# Patient Record
Sex: Female | Born: 1960 | Race: Black or African American | Hispanic: No | Marital: Married | Smoking: Never smoker
Health system: Southern US, Community
[De-identification: ages and names within clinical notes are randomized; demographics above are authoritative.]

## PROBLEM LIST (undated history)

## (undated) DIAGNOSIS — I10 Essential (primary) hypertension: Secondary | ICD-10-CM

## (undated) DIAGNOSIS — R002 Palpitations: Secondary | ICD-10-CM

## (undated) DIAGNOSIS — E669 Obesity, unspecified: Secondary | ICD-10-CM

## (undated) HISTORY — DX: Obesity, unspecified: E66.9

## (undated) HISTORY — DX: Palpitations: R00.2

## (undated) HISTORY — PX: ECTOPIC PREGNANCY SURGERY: SHX613

---

## 1988-02-10 HISTORY — PX: ECTOPIC PREGNANCY SURGERY: SHX613

## 1998-02-09 ENCOUNTER — Emergency Department (HOSPITAL_COMMUNITY): Admission: EM | Admit: 1998-02-09 | Discharge: 1998-02-09 | Payer: Self-pay | Admitting: Emergency Medicine

## 1998-02-09 ENCOUNTER — Encounter: Payer: Self-pay | Admitting: Emergency Medicine

## 1999-03-12 ENCOUNTER — Other Ambulatory Visit: Admission: RE | Admit: 1999-03-12 | Discharge: 1999-03-12 | Payer: Self-pay | Admitting: Obstetrics and Gynecology

## 2000-09-03 ENCOUNTER — Encounter: Payer: Self-pay | Admitting: Obstetrics and Gynecology

## 2000-09-03 ENCOUNTER — Ambulatory Visit (HOSPITAL_COMMUNITY): Admission: RE | Admit: 2000-09-03 | Discharge: 2000-09-03 | Payer: Self-pay | Admitting: Obstetrics and Gynecology

## 2000-09-08 ENCOUNTER — Emergency Department (HOSPITAL_COMMUNITY): Admission: EM | Admit: 2000-09-08 | Discharge: 2000-09-09 | Payer: Self-pay | Admitting: *Deleted

## 2001-09-05 ENCOUNTER — Ambulatory Visit (HOSPITAL_COMMUNITY): Admission: RE | Admit: 2001-09-05 | Discharge: 2001-09-05 | Payer: Self-pay | Admitting: Obstetrics and Gynecology

## 2001-09-05 ENCOUNTER — Encounter: Payer: Self-pay | Admitting: Family Medicine

## 2003-09-20 ENCOUNTER — Emergency Department (HOSPITAL_COMMUNITY): Admission: EM | Admit: 2003-09-20 | Discharge: 2003-09-21 | Payer: Self-pay | Admitting: Emergency Medicine

## 2004-02-14 ENCOUNTER — Emergency Department (HOSPITAL_COMMUNITY): Admission: EM | Admit: 2004-02-14 | Discharge: 2004-02-14 | Payer: Self-pay | Admitting: Emergency Medicine

## 2004-05-14 ENCOUNTER — Emergency Department (HOSPITAL_COMMUNITY): Admission: EM | Admit: 2004-05-14 | Discharge: 2004-05-14 | Payer: Self-pay | Admitting: Emergency Medicine

## 2004-10-11 ENCOUNTER — Emergency Department (HOSPITAL_COMMUNITY): Admission: EM | Admit: 2004-10-11 | Discharge: 2004-10-11 | Payer: Self-pay | Admitting: Emergency Medicine

## 2005-06-24 ENCOUNTER — Emergency Department (HOSPITAL_COMMUNITY): Admission: EM | Admit: 2005-06-24 | Discharge: 2005-06-24 | Payer: Self-pay | Admitting: Emergency Medicine

## 2005-11-15 ENCOUNTER — Emergency Department (HOSPITAL_COMMUNITY): Admission: EM | Admit: 2005-11-15 | Discharge: 2005-11-15 | Payer: Self-pay | Admitting: Emergency Medicine

## 2006-03-18 ENCOUNTER — Emergency Department (HOSPITAL_COMMUNITY): Admission: EM | Admit: 2006-03-18 | Discharge: 2006-03-19 | Payer: Self-pay | Admitting: Emergency Medicine

## 2007-10-21 ENCOUNTER — Emergency Department (HOSPITAL_COMMUNITY): Admission: EM | Admit: 2007-10-21 | Discharge: 2007-10-21 | Payer: Self-pay | Admitting: Family Medicine

## 2008-02-29 ENCOUNTER — Emergency Department (HOSPITAL_COMMUNITY): Admission: EM | Admit: 2008-02-29 | Discharge: 2008-02-29 | Payer: Self-pay | Admitting: Emergency Medicine

## 2009-05-12 ENCOUNTER — Emergency Department (HOSPITAL_COMMUNITY): Admission: EM | Admit: 2009-05-12 | Discharge: 2009-05-12 | Payer: Self-pay | Admitting: Emergency Medicine

## 2010-03-01 ENCOUNTER — Encounter: Payer: Self-pay | Admitting: Obstetrics and Gynecology

## 2010-03-31 ENCOUNTER — Emergency Department (HOSPITAL_COMMUNITY)
Admission: EM | Admit: 2010-03-31 | Discharge: 2010-03-31 | Disposition: A | Discharge status: Home / Self Care | Payer: Self-pay | Attending: Emergency Medicine | Admitting: Emergency Medicine

## 2010-03-31 ENCOUNTER — Emergency Department (HOSPITAL_COMMUNITY): Payer: Self-pay

## 2010-03-31 DIAGNOSIS — K2970 Gastritis, unspecified, without bleeding: Secondary | ICD-10-CM | POA: Insufficient documentation

## 2010-03-31 DIAGNOSIS — R05 Cough: Secondary | ICD-10-CM | POA: Insufficient documentation

## 2010-03-31 DIAGNOSIS — R059 Cough, unspecified: Secondary | ICD-10-CM | POA: Insufficient documentation

## 2010-03-31 DIAGNOSIS — R079 Chest pain, unspecified: Secondary | ICD-10-CM | POA: Insufficient documentation

## 2010-03-31 DIAGNOSIS — B9789 Other viral agents as the cause of diseases classified elsewhere: Secondary | ICD-10-CM | POA: Insufficient documentation

## 2010-03-31 LAB — URINE MICROSCOPIC-ADD ON

## 2010-03-31 LAB — URINALYSIS, ROUTINE W REFLEX MICROSCOPIC
Hgb urine dipstick: NEGATIVE
Nitrite: NEGATIVE
Protein, ur: 30 mg/dL — AB
Specific Gravity, Urine: 1.036 — ABNORMAL HIGH (ref 1.005–1.030)

## 2010-03-31 LAB — COMPREHENSIVE METABOLIC PANEL
AST: 21 U/L (ref 0–37)
Alkaline Phosphatase: 66 U/L (ref 39–117)
BUN: 9 mg/dL (ref 6–23)
CO2: 25 mEq/L (ref 19–32)
GFR calc Af Amer: >60 mL/min (ref >60)
GFR calc non Af Amer: >60 mL/min (ref >60)
Glucose, Bld: 174 mg/dL — ABNORMAL HIGH (ref 70–99)
Potassium: 3.6 mEq/L (ref 3.5–5.1)

## 2010-03-31 LAB — CBC
Hemoglobin: 12.6 g/dL (ref 12.0–15.0)
MCH: 29.3 pg (ref 26.0–34.0)
MCHC: 33.2 g/dL (ref 30.0–36.0)
MCV: 88.4 fL (ref 78.0–100.0)
RBC: 4.3 MIL/uL (ref 3.87–5.11)

## 2010-03-31 LAB — RAPID STREP SCREEN (MED CTR MEBANE ONLY): Streptococcus, Group A Screen (Direct): NEGATIVE

## 2010-03-31 LAB — DIFFERENTIAL
Eosinophils Relative: 1 % (ref 0–5)
Lymphocytes Relative: 18 % (ref 12–46)
Lymphs Abs: 1.4 10*3/uL (ref 0.7–4.0)
Monocytes Absolute: 0.7 10*3/uL (ref 0.1–1.0)
Monocytes Relative: 9 % (ref 3–12)
Neutro Abs: 5.9 10*3/uL (ref 1.7–7.7)

## 2010-04-30 LAB — URINALYSIS, ROUTINE W REFLEX MICROSCOPIC
Glucose, UA: NEGATIVE mg/dL
Hgb urine dipstick: NEGATIVE
Nitrite: NEGATIVE
Protein, ur: NEGATIVE mg/dL
Specific Gravity, Urine: 1.004 — ABNORMAL LOW (ref 1.005–1.030)
Urobilinogen, UA: 0.2 mg/dL (ref 0.0–1.0)
pH: 6 (ref 5.0–8.0)

## 2010-05-13 ENCOUNTER — Emergency Department (HOSPITAL_COMMUNITY)
Admission: EM | Admit: 2010-05-13 | Discharge: 2010-05-13 | Disposition: A | Discharge status: Home / Self Care | Payer: Self-pay | Attending: Emergency Medicine | Admitting: Emergency Medicine

## 2010-05-13 ENCOUNTER — Emergency Department (HOSPITAL_COMMUNITY): Payer: Self-pay

## 2010-05-13 DIAGNOSIS — R5381 Other malaise: Secondary | ICD-10-CM | POA: Insufficient documentation

## 2010-05-13 DIAGNOSIS — R070 Pain in throat: Secondary | ICD-10-CM | POA: Insufficient documentation

## 2010-05-13 DIAGNOSIS — R0982 Postnasal drip: Secondary | ICD-10-CM | POA: Insufficient documentation

## 2010-05-13 DIAGNOSIS — J3489 Other specified disorders of nose and nasal sinuses: Secondary | ICD-10-CM | POA: Insufficient documentation

## 2010-05-13 DIAGNOSIS — IMO0001 Reserved for inherently not codable concepts without codable children: Secondary | ICD-10-CM | POA: Insufficient documentation

## 2010-05-13 DIAGNOSIS — R059 Cough, unspecified: Secondary | ICD-10-CM | POA: Insufficient documentation

## 2010-05-13 DIAGNOSIS — R05 Cough: Secondary | ICD-10-CM | POA: Insufficient documentation

## 2010-05-13 DIAGNOSIS — R5383 Other fatigue: Secondary | ICD-10-CM | POA: Insufficient documentation

## 2010-05-13 DIAGNOSIS — R6883 Chills (without fever): Secondary | ICD-10-CM | POA: Insufficient documentation

## 2010-05-13 DIAGNOSIS — J329 Chronic sinusitis, unspecified: Secondary | ICD-10-CM | POA: Insufficient documentation

## 2010-05-13 DIAGNOSIS — R630 Anorexia: Secondary | ICD-10-CM | POA: Insufficient documentation

## 2010-05-13 LAB — BASIC METABOLIC PANEL
CO2: 27 mEq/L (ref 19–32)
Calcium: 9.6 mg/dL (ref 8.4–10.5)
Chloride: 101 mEq/L (ref 96–112)
Creatinine, Ser: 0.82 mg/dL (ref 0.4–1.2)
GFR calc non Af Amer: >60 mL/min (ref >60)
Glucose, Bld: 192 mg/dL — ABNORMAL HIGH (ref 70–99)
Potassium: 4.1 mEq/L (ref 3.5–5.1)

## 2010-05-13 LAB — CBC
HCT: 35.6 % — ABNORMAL LOW (ref 36.0–46.0)
Hemoglobin: 11.6 g/dL — ABNORMAL LOW (ref 12.0–15.0)
MCHC: 32.6 g/dL (ref 30.0–36.0)

## 2010-05-13 LAB — URINALYSIS, ROUTINE W REFLEX MICROSCOPIC
Hgb urine dipstick: NEGATIVE
Ketones, ur: NEGATIVE mg/dL
Protein, ur: NEGATIVE mg/dL
Specific Gravity, Urine: 1.02 (ref 1.005–1.030)
pH: 8 (ref 5.0–8.0)

## 2010-05-13 LAB — DIFFERENTIAL
Basophils Absolute: 0 10*3/uL (ref 0.0–0.1)
Eosinophils Relative: 2 % (ref 0–5)
Monocytes Relative: 7 % (ref 3–12)
Neutro Abs: 5.7 10*3/uL (ref 1.7–7.7)

## 2010-05-13 LAB — POCT CARDIAC MARKERS: Myoglobin, poc: 74.8 ng/mL (ref 12–200)

## 2010-05-26 LAB — GLUCOSE, CAPILLARY: Glucose-Capillary: 112 mg/dL — ABNORMAL HIGH (ref 70–99)

## 2010-11-12 LAB — POCT URINALYSIS DIP (DEVICE)
Glucose, UA: NEGATIVE
Ketones, ur: NEGATIVE
Operator id: 247071
Specific Gravity, Urine: 1.025
Urobilinogen, UA: 0.2
pH: 5.5

## 2010-12-17 ENCOUNTER — Emergency Department (HOSPITAL_COMMUNITY)
Admission: EM | Admit: 2010-12-17 | Discharge: 2010-12-17 | Disposition: A | Discharge status: Home / Self Care | Payer: Self-pay | Attending: Emergency Medicine | Admitting: Emergency Medicine

## 2010-12-17 ENCOUNTER — Encounter: Payer: Self-pay | Admitting: Emergency Medicine

## 2010-12-17 ENCOUNTER — Other Ambulatory Visit: Payer: Self-pay

## 2010-12-17 ENCOUNTER — Emergency Department (HOSPITAL_COMMUNITY): Payer: Self-pay

## 2010-12-17 DIAGNOSIS — R739 Hyperglycemia, unspecified: Secondary | ICD-10-CM

## 2010-12-17 DIAGNOSIS — M25519 Pain in unspecified shoulder: Secondary | ICD-10-CM | POA: Insufficient documentation

## 2010-12-17 DIAGNOSIS — R7309 Other abnormal glucose: Secondary | ICD-10-CM | POA: Insufficient documentation

## 2010-12-17 DIAGNOSIS — R079 Chest pain, unspecified: Secondary | ICD-10-CM | POA: Insufficient documentation

## 2010-12-17 DIAGNOSIS — Z79899 Other long term (current) drug therapy: Secondary | ICD-10-CM | POA: Insufficient documentation

## 2010-12-17 DIAGNOSIS — N926 Irregular menstruation, unspecified: Secondary | ICD-10-CM | POA: Insufficient documentation

## 2010-12-17 DIAGNOSIS — E669 Obesity, unspecified: Secondary | ICD-10-CM | POA: Insufficient documentation

## 2010-12-17 DIAGNOSIS — J45909 Unspecified asthma, uncomplicated: Secondary | ICD-10-CM | POA: Insufficient documentation

## 2010-12-17 LAB — CBC
HCT: 36.8 % (ref 36.0–46.0)
Hemoglobin: 12.6 g/dL (ref 12.0–15.0)
MCH: 29.7 pg (ref 26.0–34.0)
MCHC: 34.2 g/dL (ref 30.0–36.0)
MCV: 86.8 fL (ref 78.0–100.0)

## 2010-12-17 LAB — COMPREHENSIVE METABOLIC PANEL
Albumin: 3.7 g/dL (ref 3.5–5.2)
BUN: 11 mg/dL (ref 6–23)
Creatinine, Ser: 0.48 mg/dL — ABNORMAL LOW (ref 0.50–1.10)
GFR calc Af Amer: >90 mL/min (ref >90)
Glucose, Bld: 248 mg/dL — ABNORMAL HIGH (ref 70–99)
Total Bilirubin: 0.5 mg/dL (ref 0.3–1.2)
Total Protein: 7.8 g/dL (ref 6.0–8.3)

## 2010-12-17 LAB — POCT I-STAT TROPONIN I: Troponin i, poc: 0 ng/mL (ref 0.00–0.08)

## 2010-12-17 LAB — URINALYSIS, ROUTINE W REFLEX MICROSCOPIC
Glucose, UA: 500 mg/dL — AB
Leukocytes, UA: NEGATIVE
Nitrite: NEGATIVE
Protein, ur: NEGATIVE mg/dL
pH: 6 (ref 5.0–8.0)

## 2010-12-17 LAB — URINE MICROSCOPIC-ADD ON

## 2010-12-17 LAB — D-DIMER, QUANTITATIVE: D-Dimer, Quant: <0.22 ug/mL-FEU (ref 0.00–0.48)

## 2010-12-17 LAB — DIFFERENTIAL
Basophils Relative: 1 % (ref 0–1)
Eosinophils Absolute: 0.1 10*3/uL (ref 0.0–0.7)
Monocytes Absolute: 0.5 10*3/uL (ref 0.1–1.0)
Monocytes Relative: 8 % (ref 3–12)

## 2010-12-17 MED ORDER — METFORMIN HCL 500 MG PO TABS: 500.0000 mg | ORAL_TABLET | Freq: Two times a day (BID) | ORAL | Status: DC

## 2010-12-17 MED ORDER — NITROGLYCERIN 0.4 MG SL SUBL
0.4000 mg | SUBLINGUAL_TABLET | SUBLINGUAL | Status: DC | PRN
  Administered 2010-12-17: 0.4 mg via SUBLINGUAL
  Filled 2010-12-17: qty 25

## 2010-12-17 NOTE — ED Notes (Signed)
Old and new ekg given to Dr. Earlene Plater

## 2010-12-17 NOTE — ED Notes (Signed)
Pt is hooked to monitor, pulse oximetry, and BP cuff.

## 2010-12-17 NOTE — ED Provider Notes (Signed)
History     CSN: [id number] Arrival date & time: 12/17/2010  7:38 AM   First MD Initiated Contact with Patient 12/17/10 [phone]      Chief Complaint  Patient presents with  . Chest Pain    (Consider location/radiation/quality/duration/timing/severity/associated sxs/prior treatment) HPI Comments: Patient is a 50 year old woman who says that last night she developed pain in her left shoulder and left chest. There is a slight pain in the right side of her chest also. She rated the pain is at 10 at that time. It lasted 10-15 minutes and went away. She also felt hot and cold at that time. She took no medication. She went to work this morning asymptomatic, will probably work developed the pain again. 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 She then was brought to the [facility] for evaluation. She has no history of heart disease in the past. She currently rates her pain at a 9.  Patient is a 50 y.o. female presenting with chest pain. The history is provided by the patient. No language interpreter was used.  Chest Pain The chest pain began 6 - 12 hours ago. Duration of episode(s) is 15 minutes. Chest pain occurs intermittently. The chest pain is unchanged. At its most intense, the pain is at 10/10. The pain is currently at 9/10. The severity of the pain is severe. The quality of the pain is described as dull. The pain does not radiate. She tried aspirin for the symptoms. Risk factors include obesity.     Past Medical History  Diagnosis Date  . Asthma     Past Surgical History  Procedure Date  . Ectopic pregnancy surgery 1990    History reviewed. No pertinent family history.  History  Substance Use Topics  . Smoking status: Never Smoker   . Smokeless tobacco: Never Used  . Alcohol Use: Yes     social drinker    OB History    Grav Para Term Preterm Abortions TAB SAB Ect Mult Living                  Review of Systems    Constitutional: Chills: she felt hot and cold.  HENT: Negative.   Eyes: Negative.   Respiratory: Negative.   Cardiovascular: Positive for chest pain.  Gastrointestinal: Negative.   Genitourinary: Negative.  Vaginal bleeding: menses are irregular.  Musculoskeletal: Negative.        There is no history of long car or airplane trips. She has no history of malignancy.  Neurological: Negative.   Psychiatric/Behavioral: Negative.     Allergies  Review of patient's allergies indicates no known allergies.  Home Medications   Current Outpatient Rx  Name Route Sig Dispense Refill  . METFORMIN HCL 500 MG PO TABS Oral Take 1 tablet (500 mg total) by mouth 2 (two) times daily with a meal. 60 tablet 0    BP 123/84  Pulse 81  Temp(Src) 98.3 F (36.8 C) (Oral)  Resp 10  Ht 5\' 6"  (1.676 m)  Wt 198 lb (89.812 kg)  BMI 31.96 kg/m2  SpO2 98%  Physical Exam  Constitutional: She is oriented to person, place, and time. She appears well-developed and well-nourished. Distressed:  Patient is in mild to moderate distress complaining of chest pain.  HENT:  Head: Normocephalic and atraumatic.  Right Ear: External ear normal.  Left Ear: External ear normal.  Mouth/Throat: Oropharynx is clear and moist.  Eyes:  Conjunctivae and EOM are normal. Pupils are equal, round, and reactive to light.  Neck: Normal range of motion. Neck supple.  Cardiovascular: Normal rate, regular rhythm and normal heart sounds.   Pulmonary/Chest: Effort normal and breath sounds normal. She exhibits no tenderness.  Abdominal: Soft. Bowel sounds are normal. There is no tenderness.  Musculoskeletal: Normal range of motion. She exhibits no edema and no tenderness.       No calf tenderness, and no Homans sign.  Neurological: She is alert and oriented to person, place, and time.       No sensory or motor deficit.  Skin: Skin is warm and dry.  Psychiatric: She has a normal mood and affect. Her behavior is normal.    ED Course   Procedures (including critical care time)  1:05 PM  Date: 12/17/2010  Rate: 96  Rhythm: normal sinus rhythm  QRS Axis: normal  Intervals: normal  ST/T Wave abnormalities: nonspecific T wave changes  Conduction Disutrbances:none  Narrative Interpretation: Abnormal EKG.   Old EKG Reviewed: unchanged  1:05 PM Pt seen --> physical exam performed.  Lab workup ordered.  EKG benign.  SL NTG ordered.  1:05 PM Cardiac workup was negative, but she had an elevated glucose of 248.  Will Rx with metformin 500 mg bid, refer to Dr. Della Goo for a PCP.   1. Chest pain, unspecified   2. Hyperglycemia              Carleene Cooper III, MD 12/17/10 [phone]

## 2010-12-17 NOTE — ED Notes (Signed)
EKG performed by Benedict Needy in triage.

## 2011-02-05 ENCOUNTER — Encounter (HOSPITAL_COMMUNITY): Payer: Self-pay | Admitting: Emergency Medicine

## 2011-02-05 ENCOUNTER — Emergency Department (HOSPITAL_COMMUNITY): Payer: Self-pay

## 2011-02-05 ENCOUNTER — Emergency Department (HOSPITAL_COMMUNITY)
Admission: EM | Admit: 2011-02-05 | Discharge: 2011-02-05 | Disposition: A | Discharge status: Home / Self Care | Payer: Self-pay | Attending: Emergency Medicine | Admitting: Emergency Medicine

## 2011-02-05 DIAGNOSIS — E119 Type 2 diabetes mellitus without complications: Secondary | ICD-10-CM | POA: Insufficient documentation

## 2011-02-05 DIAGNOSIS — J3489 Other specified disorders of nose and nasal sinuses: Secondary | ICD-10-CM | POA: Insufficient documentation

## 2011-02-05 DIAGNOSIS — I10 Essential (primary) hypertension: Secondary | ICD-10-CM | POA: Insufficient documentation

## 2011-02-05 DIAGNOSIS — B349 Viral infection, unspecified: Secondary | ICD-10-CM

## 2011-02-05 DIAGNOSIS — R6883 Chills (without fever): Secondary | ICD-10-CM | POA: Insufficient documentation

## 2011-02-05 DIAGNOSIS — J029 Acute pharyngitis, unspecified: Secondary | ICD-10-CM | POA: Insufficient documentation

## 2011-02-05 DIAGNOSIS — R059 Cough, unspecified: Secondary | ICD-10-CM | POA: Insufficient documentation

## 2011-02-05 DIAGNOSIS — R51 Headache: Secondary | ICD-10-CM | POA: Insufficient documentation

## 2011-02-05 DIAGNOSIS — IMO0001 Reserved for inherently not codable concepts without codable children: Secondary | ICD-10-CM | POA: Insufficient documentation

## 2011-02-05 DIAGNOSIS — R5381 Other malaise: Secondary | ICD-10-CM | POA: Insufficient documentation

## 2011-02-05 DIAGNOSIS — R05 Cough: Secondary | ICD-10-CM | POA: Insufficient documentation

## 2011-02-05 DIAGNOSIS — J45909 Unspecified asthma, uncomplicated: Secondary | ICD-10-CM | POA: Insufficient documentation

## 2011-02-05 DIAGNOSIS — B9789 Other viral agents as the cause of diseases classified elsewhere: Secondary | ICD-10-CM | POA: Insufficient documentation

## 2011-02-05 HISTORY — DX: Essential (primary) hypertension: I10

## 2011-02-05 LAB — GLUCOSE, CAPILLARY

## 2011-02-05 MED ORDER — METFORMIN HCL 500 MG PO TABS: 500.0000 mg | ORAL_TABLET | Freq: Two times a day (BID) | ORAL | Status: DC

## 2011-02-05 NOTE — ED Notes (Signed)
PT. REPORTS PRODUCTIVE COUGH WITH HEADACHE AND BODY ACHES FOR 4 DAYS , RUNNY NOSE , CHILLS WITH FEVER.

## 2011-02-05 NOTE — ED Provider Notes (Signed)
Medical screening examination/treatment/procedure(s) were performed by non-physician practitioner and as supervising physician I was immediately available for consultation/collaboration.   Forbes Cellar, MD 02/05/11 [phone]

## 2011-02-05 NOTE — ED Provider Notes (Signed)
History     CSN: [id number]  Arrival date & time 02/05/11  4098   First MD Initiated Contact with Patient 02/05/11 [phone]      Chief Complaint  Patient presents with  . Cough    (Consider location/radiation/quality/duration/timing/severity/associated sxs/prior treatment) Patient is a 50 y.o. female presenting with cough. The history is provided by the patient.  Cough This is a new problem. The current episode started more than 2 days ago. The problem has not changed since onset.The cough is productive of sputum. There has been no fever. Associated symptoms include chills, headaches, rhinorrhea, sore throat and myalgias. Pertinent negatives include no chest pain, no ear pain, no shortness of breath and no wheezing. She has tried decongestants for the symptoms. The treatment provided mild relief. She is not a smoker. Her past medical history is significant for asthma.   the patient works in a nursing home. She has been feeling ill for 4 days but denies any fever. Did not receive a flu vaccine this year.  Past Medical History  Diagnosis Date  . Asthma   . Hypertension   . Diabetes mellitus     Past Surgical History  Procedure Date  . Ectopic pregnancy surgery 1990  . Ectopic pregnancy surgery     No family history on file.  History  Substance Use Topics  . Smoking status: Never Smoker   . Smokeless tobacco: Never Used  . Alcohol Use: Yes     social drinker     Review of Systems  Constitutional: Positive for chills and fatigue. Negative for fever.  HENT: Positive for congestion, sore throat and rhinorrhea. Negative for ear pain, trouble swallowing, neck pain, neck stiffness and tinnitus.   Eyes: Negative for pain and visual disturbance.  Respiratory: Positive for cough. Negative for chest tightness, shortness of breath and wheezing.   Cardiovascular: Negative for chest pain and leg swelling.  Gastrointestinal: Positive for nausea. Negative for vomiting, abdominal pain and  diarrhea.  Genitourinary: Negative for dysuria and hematuria.  Musculoskeletal: Positive for myalgias. Negative for back pain and joint swelling.  Skin: Negative for rash and wound.  Neurological: Positive for headaches. Negative for syncope, weakness and light-headedness.  Psychiatric/Behavioral: Negative for confusion.    Allergies  Review of patient's allergies indicates no known allergies.  Home Medications   Current Outpatient Rx  Name Route Sig Dispense Refill  . METFORMIN HCL 500 MG PO TABS Oral Take 500 mg by mouth once. Not on this regularly.       BP 141/80  Pulse 95  Temp(Src) 99.2 F (37.3 C) (Oral)  Resp 14  SpO2 96%  Physical Exam  Nursing note and vitals reviewed. Constitutional: She is oriented to person, place, and time. She appears well-developed and well-nourished. No distress.  HENT:  Head: Normocephalic and atraumatic.  Right Ear: Tympanic membrane, external ear and ear canal normal.  Left Ear: Tympanic membrane, external ear and ear canal normal.  Nose: Rhinorrhea present.  Mouth/Throat: Uvula is midline and mucous membranes are normal. Posterior oropharyngeal erythema present. No oropharyngeal exudate or posterior oropharyngeal edema.  Eyes: Conjunctivae are normal. Pupils are equal, round, and reactive to light.  Neck: Normal range of motion. Neck supple.  Cardiovascular: Normal rate, regular rhythm, normal heart sounds and intact distal pulses.   Pulmonary/Chest: Effort normal and breath sounds normal. No respiratory distress. She has no wheezes. She exhibits no tenderness.  Abdominal: Soft. Bowel sounds are normal. She exhibits no distension. There is no tenderness.  Musculoskeletal:  Normal range of motion. She exhibits no edema and no tenderness.  Lymphadenopathy:    She has no cervical adenopathy.  Neurological: She is alert and oriented to person, place, and time. No cranial nerve deficit. Coordination normal.  Skin: Skin is warm and dry. No  rash noted. No erythema.  Psychiatric: She has a normal mood and affect. Her behavior is normal.    ED Course  Procedures (including critical care time)  Labs Reviewed  GLUCOSE, CAPILLARY - Abnormal; Notable for the following:    Glucose-Capillary 236 (*)    All other components within normal limits   Dg Chest 2 View  02/05/2011  *RADIOLOGY REPORT*  Clinical Data: Productive cough, headache and body aches.  Chills and fever.  CHEST - 2 VIEW  Comparison: Chest radiograph performed 12/17/2010  Findings: The lungs are well-aerated and clear.  There is no evidence of focal opacification, pleural effusion or pneumothorax. An apparent asymmetric left-sided nipple shadow is noted.  The heart is normal in size; the mediastinal contour is within normal limits.  No acute osseous abnormalities are seen.  IMPRESSION: No acute cardiopulmonary process seen.  Original Report Authenticated By: Tonia Ghent, M.D.     Dx 1: Viral illness Dx 2: Diabetes Mellitus   MDM  The patient's symptoms are most likely secondary to a viral illness. As she has had no fever, influenza is less likely. Her chest x-ray has been reviewed and is negative for any pneumonia. I do not hear any wheezing on auscultation of her lungs to suggest an asthma exacerbation. She is not in any distress and speaks in complete sentences. I have discussed with her the use of over-the-counter medications for symptomatic treatment as well as oral fluid hydration.  The patient reports to me that she is not taking her metformin as was directed by the emergency physician upon her last visit. She denies blurred vision, polyuria, and polydipsia. I counseled her at length regarding the dangers of uncontrolled diabetes and have checked her blood sugar. It is not high enough to warrant insulin therapy, though I have encouraged good oral fluid hydration. She will start taking metformin as per hour discussion and I will put the resource guide on her  discharge paperwork so that she may obtain a primary care physician for continued management.        [address] [place], [place] 02/05/11 0656  Shaaron Adler, PA 02/05/11 [phone]

## 2011-02-05 NOTE — ED Notes (Signed)
Pt c/o cough, congestion and runny nose for a few days. No resp distress noted.

## 2011-04-30 ENCOUNTER — Other Ambulatory Visit: Payer: Self-pay

## 2011-04-30 ENCOUNTER — Emergency Department (INDEPENDENT_AMBULATORY_CARE_PROVIDER_SITE_OTHER)
Admission: EM | Admit: 2011-04-30 | Discharge: 2011-04-30 | Disposition: A | Discharge status: Home / Self Care | Payer: Self-pay | Source: Home / Self Care | Attending: Emergency Medicine | Admitting: Emergency Medicine

## 2011-04-30 ENCOUNTER — Encounter (HOSPITAL_COMMUNITY): Payer: Self-pay | Admitting: Emergency Medicine

## 2011-04-30 DIAGNOSIS — Z760 Encounter for issue of repeat prescription: Secondary | ICD-10-CM

## 2011-04-30 DIAGNOSIS — R079 Chest pain, unspecified: Secondary | ICD-10-CM

## 2011-04-30 LAB — POCT I-STAT, CHEM 8
BUN: 10 mg/dL (ref 6–23)
Calcium, Ion: 1.23 mmol/L (ref 1.12–1.32)
HCT: 45 % (ref 36.0–46.0)
Hemoglobin: 15.3 g/dL — ABNORMAL HIGH (ref 12.0–15.0)
Sodium: 139 mEq/L (ref 135–145)
TCO2: 25 mmol/L (ref 0–100)

## 2011-04-30 MED ORDER — GLUCOSE BLOOD VI STRP: ORAL_STRIP | Status: DC

## 2011-04-30 MED ORDER — METFORMIN HCL 500 MG PO TABS: 500.0000 mg | ORAL_TABLET | Freq: Two times a day (BID) | ORAL | Status: DC

## 2011-04-30 NOTE — Discharge Instructions (Signed)
You must take your metformin on a regular basis. I and prescribing a machine for you to monitor your glucose was. He can also help control your diabetes through diet and exercise and weight loss. Give [place] a working phone number, so that [facility] cardiology can contact you. They want to see you within the next few days for further evaluation, and/or Holter monitoring and a stress test. Return immediately to the ER if you have symptoms that get worse, do not go away, if the pain goes up her neck or down her left arm, nausea, if you feel sweaty, if you feel like you're about to pass out, if you passout, or any other concerns.

## 2011-04-30 NOTE — ED Provider Notes (Signed)
History     CSN: [id number]  Arrival date & time 04/30/11  0845   First MD Initiated Contact with Patient 04/30/11 1007      Chief Complaint  Patient presents with  . Chest Pain    (Consider location/radiation/quality/duration/timing/severity/associated sxs/prior treatment) HPI Comments: Patient presents with intermittent episodes of palpitations, and "achy", chest pain lasting minutes and then resolving. States that it occasionally radiates to her left shoulder. No radiation to neck or down her arm. no exertional or positional component to her pain. No abdominal pain, tearing sensation through to her back. 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 Patient states she felt dizzy, but no presyncope or syncope. Patient was told to try Valsalva maneuver, which stopped her symptoms. Patient denies excess caffeine intake, steroid use. Patient has been having these symptoms since November 2012. She was seen  in [place] for this. Cardiac workup, including troponin x 2, were negative. Was diagnosed with unspecified chest pain. Patient did not have a stress test done.  Patient was also hyperglycemic at 248. She was started on metformin 500 mg twice a day and was referred to a primary care physician. Patient states she takes her metformin on an irregular basis, but has been taking it somewhat consistently recently. Does not have a glucometer at home. No polyuria, polydipsia, abdominal pain, unintentional weight loss.  ROS as noted in HPI. All other ROS negative.   Patient is a 51 y.o. female presenting with chest pain. The history is provided by the patient. No language interpreter was used.  Chest Pain The chest pain began more  than 1 month ago. Chest pain occurs intermittently. The chest pain is unchanged. The pain is associated with stress. The quality of the pain is described as aching. The pain does  not radiate. She tried nothing for the symptoms. Risk factors include obesity and stress.  Her past medical history is significant for diabetes and hypertension.  Pertinent negatives for past medical history include no arrhythmia, no CAD, no CHF, no DVT and no MI.  Pertinent negatives for family medical history include: no CAD in family and no early MI in family.     Past Medical History  Diagnosis Date  . Asthma   . Hypertension   . Diabetes mellitus   . Chest pain     Past Surgical History  Procedure Date  . Ectopic pregnancy surgery 1990  . Ectopic pregnancy surgery     History reviewed. No pertinent family history.  History  Substance Use Topics  . Smoking status: Never Smoker   . Smokeless tobacco: Never Used  . Alcohol Use: Yes     social drinker    OB History    Grav Para Term Preterm Abortions TAB SAB Ect Mult Living                  Review of Systems  Cardiovascular: Positive for chest pain.    Allergies  Review of patient's allergies indicates no known allergies.  Home Medications   Current Outpatient Rx  Name Route Sig Dispense Refill  . GLUCOSE BLOOD VI STRP  Check your sugar in the morning before you eat breakfast, and one hour after a meal. 100 each 12  . METFORMIN HCL 500 MG PO TABS Oral Take 1 tablet (500 mg total) by mouth 2 (two) times daily with a meal. 60 tablet 0  BP 126/81  Pulse 74  Temp(Src) 97.7 F (36.5 C) (Oral)  Resp 16  SpO2 100%  LMP 04/14/2011  Physical Exam  Nursing note and vitals reviewed. Constitutional: She is oriented to person, place, and time. She appears well-developed and well-nourished.  HENT:  Head: Normocephalic and atraumatic.  Eyes: Conjunctivae and EOM are normal.  Neck: Normal range of motion. No thyromegaly present.  Cardiovascular: Normal rate, regular rhythm, normal heart sounds and intact distal pulses.   No murmur heard. Pulmonary/Chest: Effort normal and breath sounds normal.  Abdominal:  Soft. Bowel sounds are normal. She exhibits no distension. There is no tenderness. There is no rebound and no guarding.  Musculoskeletal: Normal range of motion. She exhibits no edema.  Neurological: She is alert and oriented to person, place, and time.  Skin: Skin is warm and dry.  Psychiatric: She has a normal mood and affect. Her behavior is normal. Judgment and thought content normal.    ED Course  Procedures (including critical care time)  Labs Reviewed  POCT I-STAT, CHEM 8 - Abnormal; Notable for the following:    Glucose, Bld 177 (*)    Hemoglobin 15.3 (*)    All other components within normal limits   No results found. Results for orders placed during the hospital encounter of 04/30/11  POCT I-STAT, CHEM 8      Component Value Range   Sodium 139  135 - 145 (mEq/L)   Potassium 3.9  3.5 - 5.1 (mEq/L)   Chloride 103  96 - 112 (mEq/L)   BUN 10  6 - 23 (mg/dL)   Creatinine, Ser 1.61  0.50 - 1.10 (mg/dL)   Glucose, Bld 096 (*) 70 - 99 (mg/dL)   Calcium, Ion 0.45  4.09 - 1.32 (mmol/L)   TCO2 25  0 - 100 (mmol/L)   Hemoglobin 15.3 (*) 12.0 - 15.0 (g/dL)   HCT 81.1  91.4 - 78.2 (%)     1. Chest pain   2. Diabetes mellitus   3. Medication refill     EKG: Normal sinus rhythm, rate 63. Normal axis, normal intervals. No hypertrophy. Nonspecific T-wave changes, present in previous EKG from November 2012.  MDM  Previous labs, records reviewed. As noted in history of present illness.   1119- discussed case with Dr. Tenny Craw, cardiologist on-call. They will contact the patient, and arrange a followup within the next several days for Holter monitor and/or stress test.  EKG unremarkable. Patient's reporting symptoms that are consistent with intermittent SVT. Patient currently asymptomatic. Vital signs within normal limits. She does however, have several risk factors for atypical chest pain/angina. Given patient's recent negative cardiac workup, currently feel that patient is stable  enough for outpatient stress test and/or Holter monitoring.. instructing patient to give [place] a working phone number, so that [facility] cardiology can contact her for a close followup appointment. Repeatedly Emphasized importance of following up with cardiology. Also checking i-STAT, and renewing patient's prescription for metformin. Sending her home with a glucometer so that she can monitor her sugars at home. Will give patient strict instructions on when to return to the ER.    Luiz Blare, MD 04/30/11 1431

## 2011-04-30 NOTE — ED Notes (Signed)
PT HERE WITH MIDSTERNUM CP THAT RADIATES TO LEFT SHOULDER INTERMITT SHARP PAINS THAT STARTED YESTERDAY.EPISODE LASTED X30MIN RELIEVED BY BEARING DOWN,"JUMPING JACKS.PT STATES SHE HAD SAME SX X 1 MNTH AGO AND WAS SEEN AT [place] BUT DOESN'T REMEMBER RESULTS.HX DIABETES 2.

## 2011-05-05 ENCOUNTER — Ambulatory Visit (INDEPENDENT_AMBULATORY_CARE_PROVIDER_SITE_OTHER): Payer: Self-pay | Admitting: Cardiology

## 2011-05-05 ENCOUNTER — Encounter: Payer: Self-pay | Admitting: Cardiology

## 2011-05-05 VITALS — BP 140/82 | HR 71 | Ht 64.0 in | Wt 215.8 lb

## 2011-05-05 DIAGNOSIS — R079 Chest pain, unspecified: Secondary | ICD-10-CM

## 2011-05-05 DIAGNOSIS — E119 Type 2 diabetes mellitus without complications: Secondary | ICD-10-CM

## 2011-05-05 DIAGNOSIS — R002 Palpitations: Secondary | ICD-10-CM

## 2011-05-05 LAB — HEMOGLOBIN A1C: Hgb A1c MFr Bld: 9.5 % — ABNORMAL HIGH (ref 4.6–6.5)

## 2011-05-05 LAB — TSH: TSH: 1.94 u[IU]/mL (ref 0.35–5.50)

## 2011-05-05 NOTE — Patient Instructions (Signed)
Your physician recommends that you have lab work today: TSH and HgbA1c  Your physician has recommended that you wear an event monitor. Event monitors are medical devices that record the heart's electrical activity. Doctors most often [place] these monitors to diagnose arrhythmias. Arrhythmias are problems with the speed or rhythm of the heartbeat. The monitor is a small, portable device. You can wear one while you do your normal daily activities. This is usually used to diagnose what is causing palpitations/syncope (passing out).  Your physician has requested that you have an exercise tolerance test. For further information please visit [email or web address] Please also follow instruction sheet, as given.  Please establish with Primary Care ([facility] [phone])

## 2011-05-05 NOTE — Progress Notes (Signed)
HPI:  Sabrina Brooks is seen as an add on new visit today in referral from urgent care.  She is originally from [place], [place].  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   We had a long discussion today also about the absolute need to establish with primary care medicine for management of her diabetes.  Her metformin has been prescribed through the visits to ER and urgent care over a period of time, and she has not had regular care.  She was seen in the ER back in the fall by Dr. Ignacia Palma with 10/10 chest pain, and it was intermittent.  Her enzymes were negative, and I have reviewed her ECG which demonstrated only non specific T flattening.  Enzymes were negative and she was discharged.  Most recently, she was seen at urgent care and set up for a visit at [facility].  Her main complaint is that of intermittent palpitations.  She has had three episodes, twice at work and once at home.  It feels like her heart is just flying.  She got dizzy with one.  She checked her pulse once and it was only 97.  It lasted less than thirty minutes.  It resolved.  She got a chest ache and some arm pain, and it resolved.    It is not exertional and there is no diaphoresis or nausea.  It will go to the left shoulder, but nowhere else.  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  She uses a Valsalva maneuver, and that resolved the symptoms at the time.  When she walks around work, she does not get tightness in her chest.  She does not have a glucometer at home, and unfortunately has been intermittent in the use of metformin.  I went through with her today many aspects of the impact of uncontrolled diabetes on her care, and how this is more likely to get her into trouble than anything else she does.  She is not a smoker.  Of note, she was referred to Dr. Lovell Sheehan from the ER, but did not go.  There is no family history of premature CAD.  Her father died  in his 54s, and mother has dementia.    Current Outpatient Prescriptions  Medication Sig Dispense Refill  . aspirin 325 MG tablet Take 325 mg by mouth daily.      . metFORMIN (GLUCOPHAGE) 500 MG tablet Take 1 tablet (500 mg total) by mouth 2 (two) times daily with a meal.  60 tablet  0    No Known Allergies  Past Medical History  Diagnosis Date  . Asthma   . Hypertension   . Diabetes mellitus   . Chest pain     Past Surgical History  Procedure Date  . Ectopic pregnancy surgery 1990  . Ectopic pregnancy surgery     No family history on file.  History   Social History  . Marital Status: Married    Spouse Name: N/A    Number of Children: N/A  . Years of Education: N/A   Occupational History  . Not on file.   Social History Main Topics  . Smoking status: Never Smoker   . Smokeless tobacco: Never Used  . Alcohol Use: Yes     social drinker  . Drug Use: No     occasional  . Sexually Active: Not on file   Other  Topics Concern  . Not on file   Social History Narrative  . No narrative on file    ROS: Please see the HPI.  All other systems reviewed and negative.  PHYSICAL EXAM:  BP 140/82  Pulse 71  Ht 5\' 4"  (1.626 m)  Wt 215 lb 12.8 oz (97.886 kg)  BMI 37.04 kg/m2  LMP 04/14/2011  General: Well developed, well nourished, in no acute distress. Head:  Normocephalic and atraumatic. Neck: no JVD Lungs: Clear to auscultation and percussion. Heart: Normal S1 and S2.  No murmur, rubs or gallops.  Abdomen:  Normal bowel sounds; soft; non tender; no organomegaly Pulses: Pulses normal in all 4 extremities. Extremities: No clubbing or cyanosis. No edema. Neurologic: Alert and oriented x 3.  EKG:  NSR.  Nonspecific T abnormality.    ASSESSMENT AND PLAN:

## 2011-05-19 ENCOUNTER — Telehealth: Payer: Self-pay | Admitting: Cardiology

## 2011-05-19 NOTE — Telephone Encounter (Signed)
FYI   WILL FORWARD TO Leotis Shames AND DR Riley Kill .Zack Seal

## 2011-05-19 NOTE — Telephone Encounter (Signed)
FYIGeorga [facility] Watch [phone] option#1  [facility] is canceling heart monitor.  Uninsured patient is noncompliant, has not returned any correspondence to date.  If patient calls [facility] will reactivate order.

## 2011-06-04 ENCOUNTER — Encounter: Payer: Self-pay | Admitting: Cardiology

## 2011-06-08 ENCOUNTER — Telehealth: Payer: Self-pay | Admitting: Cardiology

## 2011-06-08 NOTE — Telephone Encounter (Signed)
New msg Pt wants to talk to you about test results 

## 2011-06-08 NOTE — Telephone Encounter (Signed)
Spoke with pt and notified her HEMOGLOBIN A -1C was elevated and it needed to be addressed, pt states no PCP--her glucophage was ordered last time she was in hospital--advised i would let dr Riley Kill and his nurse lauren know --pt agrees--nt

## 2011-06-09 DIAGNOSIS — E119 Type 2 diabetes mellitus without complications: Secondary | ICD-10-CM | POA: Insufficient documentation

## 2011-06-09 DIAGNOSIS — R002 Palpitations: Secondary | ICD-10-CM | POA: Insufficient documentation

## 2011-06-09 DIAGNOSIS — R079 Chest pain, unspecified: Secondary | ICD-10-CM | POA: Insufficient documentation

## 2011-06-09 NOTE — Assessment & Plan Note (Signed)
The episode at the place of work seemed to be more sudden in onset, and valsalva did help.  Wether she has something like SVT is unknown.  A TSH would be helpful, and rule out occult thyroid disease as an issue.

## 2011-06-09 NOTE — Assessment & Plan Note (Signed)
The symptoms are not exertional and seem somewhat related to a fast pulse.  She does have the DM risk factor, and she is poorly controlled, and I reviewed that with her in some detail so that she understands.  We will set her up for a stress test to assess, concomitant with an event monitor to see the correlation with that.  She will return at the next available time.

## 2011-06-09 NOTE — Assessment & Plan Note (Signed)
Will check a Hgb A1c to assess where she is on the curve.  She is on metformin. She likely needs nutritional and complete diabetes education which could be eventually set up through her primary care MD.  Her care has been sporadic, and I mentioned to her that we were not in a position to take on her diabetes management.

## 2011-06-12 NOTE — Telephone Encounter (Signed)
Will address at upcoming appointment with Dr Riley Kill on 06/15/11.

## 2011-06-15 ENCOUNTER — Encounter: Payer: Self-pay | Admitting: Cardiology

## 2011-07-15 NOTE — Telephone Encounter (Signed)
I attempted to reach the pt at work number about needing follow-up but she has already left for the day.

## 2011-07-17 NOTE — Telephone Encounter (Signed)
I spoke with the pt and made her aware that we need to reschedule her GXT (pt no showed 06/15/11).  This has been rescheduled to 08/20/11.  I also made the pt aware of HgbA1c results and she said she is trying to obtain a PCP.  I educated the pt about the importance of managing her blood glucose.

## 2011-08-20 ENCOUNTER — Encounter: Payer: Self-pay | Admitting: Nurse Practitioner

## 2011-08-20 ENCOUNTER — Encounter (INDEPENDENT_AMBULATORY_CARE_PROVIDER_SITE_OTHER): Payer: Self-pay | Admitting: Cardiology

## 2011-08-20 ENCOUNTER — Ambulatory Visit (INDEPENDENT_AMBULATORY_CARE_PROVIDER_SITE_OTHER): Payer: Self-pay | Admitting: Nurse Practitioner

## 2011-08-20 VITALS — BP 118/69 | HR 90 | Resp 20 | Ht 64.0 in | Wt 215.0 lb

## 2011-08-20 DIAGNOSIS — R079 Chest pain, unspecified: Secondary | ICD-10-CM

## 2011-08-20 DIAGNOSIS — R002 Palpitations: Secondary | ICD-10-CM

## 2011-08-20 NOTE — Patient Instructions (Signed)
Instructed patient to work on risk factors with diabetes management, exercise and weight loss.   We will see back prn.

## 2011-08-20 NOTE — Procedures (Deleted)
Exercise Treadmill Test  Pre-Exercise Testing Evaluation Rhythm: normal sinus  Rate: 95   PR:  .15 QRS:  .08  QT:  .36 QTc: .45     Test  Exercise Tolerance Test Ordering MD: Shawnie Pons, MD  Interpreting MD: Norma Fredrickson , NP  Unique Test No: 1  Treadmill:  1  Indication for ETT: chest pain - rule out ischemia  Contraindication to ETT: No   Stress Modality: exercise - treadmill  Cardiac Imaging Performed: non   Protocol: standard Bruce - maximal  Max BP:  ***/***  Max MPHR (bpm):  170 85% MPR (bpm):  144  MPHR obtained (bpm):  *** % MPHR obtained:  ***  Reached 85% MPHR (min:sec):  *** Total Exercise Time (min-sec):  ***  Workload in METS:  *** Borg Scale: ***  Reason ETT Terminated:  {CHL REASON TERMINATED FOR WJX:91478295}    ST Segment Analysis At Rest: {CHL ST SEGMENT AT REST FOR AOZ:30865784} With Exercise: {CHL ST SEGMENT WITH EXERCISE FOR ONG:29528413}  Other Information Arrhythmia:  {CHL ARRHYTHMIA FOR KGM:01027253} Angina during ETT:  {CHL ANGINA DURING GUY:40347425} Quality of ETT:  {CHL QUALITY OF ZDG:38756433}  ETT Interpretation:  {CHL INTERPRETATION FOR IRJ:18841660}  Comments: ***  Recommendations: ***

## 2011-08-20 NOTE — Progress Notes (Signed)
Exercise Treadmill Test  Pre-Exercise Testing Evaluation Rhythm: normal sinus  Rate: 95   PR:  .15 QRS:  .08  QT:  .36 QTc: .45     Test  Exercise Tolerance Test Ordering MD: Shawnie Pons, MD  Interpreting MD: Norma Fredrickson , NP  Unique Test No: 1  Treadmill:  1  Indication for ETT: chest pain - rule out ischemia  Contraindication to ETT: No   Stress Modality: exercise - treadmill  Cardiac Imaging Performed: non   Protocol: standard Bruce - maximal  Max BP:  147/68  Max MPHR (bpm):  170 85% MPR (bpm):  144  MPHR obtained (bpm):  158 % MPHR obtained:  92%  Reached 85% MPHR (min:sec):  3:30 Total Exercise Time (min-sec):  6:04  Workload in METS:  7.0 Borg Scale: 18  Reason ETT Terminated:  patient's desire to stop    ST Segment Analysis At Rest: normal ST segments - no evidence of significant ST depression; with resting T wave changes With Exercise: no evidence of significant ST depression  Other Information Arrhythmia:  No Angina during ETT:  absent (0) Quality of ETT:  diagnostic  ETT Interpretation:  normal - no evidence of ischemia by ST analysis  Comments: Patient presents today for routine GXT for atypical chest pain. She saw Dr. Riley Kill back in March for evaluation. GXT was recommended. Resting EKG showed diffuse T wave changes. She has had scheduling issues. She has DM and obesity. Had been having palpitations as well. Has not had her event monitor due to no insurance.   Since her last visit, she says she has been doing ok. Seeing Dr. Bruna Potter for primary care tomorrow. No real chest pain. Has had some fast heart beating but does admit to excessive caffeine use.   She exercised on the standard Bruce protocol today for a total of 6 minutes. She has reduced and poor exercise tolerance. Target reached at 3:30. Adequate blood pressure response. Clinically negative for chest pain. Test stopped due to fatigue. No arrhythmia. Resting EKG with diffuse T wave changes. No ST changes  with exercise.   Recommendations: Regular walking program. Weight loss.  Diabetes management. She is seeing Dr. Bruna Potter tomorrow Avoidance of caffeine.   We will see her back as needed. Patient is agreeable to this plan and will call if any problems develop in the interim.

## 2011-09-01 ENCOUNTER — Emergency Department (INDEPENDENT_AMBULATORY_CARE_PROVIDER_SITE_OTHER)
Admission: EM | Admit: 2011-09-01 | Discharge: 2011-09-01 | Disposition: A | Discharge status: Home / Self Care | Payer: Self-pay | Source: Home / Self Care | Attending: Emergency Medicine | Admitting: Emergency Medicine

## 2011-09-01 ENCOUNTER — Encounter (HOSPITAL_COMMUNITY): Payer: Self-pay

## 2011-09-01 DIAGNOSIS — K089 Disorder of teeth and supporting structures, unspecified: Secondary | ICD-10-CM

## 2011-09-01 DIAGNOSIS — K0889 Other specified disorders of teeth and supporting structures: Secondary | ICD-10-CM

## 2011-09-01 MED ORDER — CHLORHEXIDINE GLUCONATE 0.12 % MT SOLN: 15.0000 mL | Freq: Two times a day (BID) | OROMUCOSAL | Status: AC

## 2011-09-01 MED ORDER — HYDROCODONE-ACETAMINOPHEN 5-325 MG PO TABS: 1.0000 | ORAL_TABLET | Freq: Four times a day (QID) | ORAL | Status: AC | PRN

## 2011-09-01 MED ORDER — PENICILLIN V POTASSIUM 500 MG PO TABS: 500.0000 mg | ORAL_TABLET | Freq: Four times a day (QID) | ORAL | Status: AC

## 2011-09-01 NOTE — ED Notes (Signed)
C/o pain and swelling left side of face

## 2011-09-01 NOTE — ED Provider Notes (Signed)
History     CSN: [id number]  Arrival date & time 09/01/11  1745   First MD Initiated Contact with Patient 09/01/11 1915      Chief Complaint  Patient presents with  . Facial Swelling    (Consider location/radiation/quality/duration/timing/severity/associated sxs/prior treatment) HPI Comments: Pt thinks has bad teeth in both upper and lower L side  Patient is a 51 y.o. female presenting with tooth pain. The history is provided by the patient.  Dental PainThe primary symptoms include mouth pain. Primary symptoms do not include dental injury, headaches, fever or sore throat. The symptoms began more than 1 week ago. The symptoms are worsening. The symptoms are new. The symptoms occur constantly.  Additional symptoms include: jaw pain, facial swelling, ear pain and swollen glands. Additional symptoms do not include: gum swelling, gum tenderness and purulent gums.    Past Medical History  Diagnosis Date  . Asthma   . Hypertension   . Diabetes mellitus   . Chest pain   . Obesity   . Palpitation     Past Surgical History  Procedure Date  . Ectopic pregnancy surgery 1990  . Ectopic pregnancy surgery     History reviewed. No pertinent family history.  History  Substance Use Topics  . Smoking status: Never Smoker   . Smokeless tobacco: Never Used  . Alcohol Use: Yes     social drinker    OB History    Grav Para Term Preterm Abortions TAB SAB Ect Mult Living                  Review of Systems  Constitutional: Negative for fever and chills.  HENT: Positive for ear pain, facial swelling and dental problem. Negative for sore throat.   Skin: Negative for color change.  Neurological: Negative for headaches.    Allergies  Review of patient's allergies indicates no known allergies.  Home Medications   Current Outpatient Rx  Name Route Sig Dispense Refill  . ASPIRIN 325 MG PO TABS Oral Take 325 mg by mouth daily.    . CHLORHEXIDINE GLUCONATE 0.12 % MT SOLN  Mouth/Throat Use as directed 15 mLs in the mouth or throat 2 (two) times daily. 240 mL 0  . HYDROCODONE-ACETAMINOPHEN 5-325 MG PO TABS Oral Take 1-2 tablets by mouth every 6 (six) hours as needed for pain. 10 tablet 0  . METFORMIN HCL 500 MG PO TABS Oral Take 1 tablet (500 mg total) by mouth 2 (two) times daily with a meal. 60 tablet 0  . PENICILLIN V POTASSIUM 500 MG PO TABS Oral Take 1 tablet (500 mg total) by mouth 4 (four) times daily. 40 tablet 0    BP 132/85  Pulse 94  Temp 98.9 F (37.2 C) (Oral)  Resp 19  SpO2 97%  Physical Exam  Constitutional: She appears well-developed and well-nourished.       Appears to be in pain  HENT:  Head: Normocephalic and atraumatic.  Right Ear: Tympanic membrane, external ear and ear canal normal.  Left Ear: Tympanic membrane, external ear and ear canal normal.  Mouth/Throat: Oropharynx is clear and moist. No dental abscesses.       Gums around L upper 2nd molar and L lower 3 molar inflamed but gums are receding and roots of both teeth are exposed.  No evidence dental abscess.   Pulmonary/Chest: Effort normal.  Lymphadenopathy:       Head (right side): No submental, no submandibular, no tonsillar, no preauricular and no posterior auricular  adenopathy present.       Head (left side): Submandibular and tonsillar adenopathy present. No preauricular and no posterior auricular adenopathy present.    She has no cervical adenopathy.    ED Course  Procedures (including critical care time)  Labs Reviewed - No data to display No results found.   1. Pain, dental       MDM          Cathlyn Parsons, NP 09/01/11 2122

## 2011-09-02 NOTE — ED Provider Notes (Signed)
Medical screening examination/treatment/procedure(s) were performed by non-physician practitioner and as supervising physician I was immediately available for consultation/collaboration.  Leslee Home, M.D.   Reuben Likes, MD 09/02/11 [phone]

## 2011-11-03 NOTE — Progress Notes (Signed)
This encounter was created in error - please disregard.

## 2012-01-19 ENCOUNTER — Emergency Department (HOSPITAL_COMMUNITY): Payer: Self-pay

## 2012-01-19 ENCOUNTER — Encounter (HOSPITAL_COMMUNITY): Payer: Self-pay | Admitting: *Deleted

## 2012-01-19 ENCOUNTER — Emergency Department (HOSPITAL_COMMUNITY)
Admission: EM | Admit: 2012-01-19 | Discharge: 2012-01-19 | Disposition: A | Discharge status: Home / Self Care | Payer: Self-pay | Attending: Emergency Medicine | Admitting: Emergency Medicine

## 2012-01-19 DIAGNOSIS — R209 Unspecified disturbances of skin sensation: Secondary | ICD-10-CM | POA: Insufficient documentation

## 2012-01-19 DIAGNOSIS — Z8679 Personal history of other diseases of the circulatory system: Secondary | ICD-10-CM | POA: Insufficient documentation

## 2012-01-19 DIAGNOSIS — E1169 Type 2 diabetes mellitus with other specified complication: Secondary | ICD-10-CM | POA: Insufficient documentation

## 2012-01-19 DIAGNOSIS — Z79899 Other long term (current) drug therapy: Secondary | ICD-10-CM | POA: Insufficient documentation

## 2012-01-19 DIAGNOSIS — R079 Chest pain, unspecified: Secondary | ICD-10-CM | POA: Insufficient documentation

## 2012-01-19 DIAGNOSIS — Z7982 Long term (current) use of aspirin: Secondary | ICD-10-CM | POA: Insufficient documentation

## 2012-01-19 DIAGNOSIS — R202 Paresthesia of skin: Secondary | ICD-10-CM

## 2012-01-19 DIAGNOSIS — E669 Obesity, unspecified: Secondary | ICD-10-CM | POA: Insufficient documentation

## 2012-01-19 DIAGNOSIS — R739 Hyperglycemia, unspecified: Secondary | ICD-10-CM

## 2012-01-19 DIAGNOSIS — I10 Essential (primary) hypertension: Secondary | ICD-10-CM | POA: Insufficient documentation

## 2012-01-19 DIAGNOSIS — M79609 Pain in unspecified limb: Secondary | ICD-10-CM | POA: Insufficient documentation

## 2012-01-19 DIAGNOSIS — J45909 Unspecified asthma, uncomplicated: Secondary | ICD-10-CM | POA: Insufficient documentation

## 2012-01-19 LAB — COMPREHENSIVE METABOLIC PANEL
AST: 15 U/L (ref 0–37)
Albumin: 4.1 g/dL (ref 3.5–5.2)
Calcium: 9.7 mg/dL (ref 8.4–10.5)
Chloride: 97 mEq/L (ref 96–112)
Creatinine, Ser: 0.7 mg/dL (ref 0.50–1.10)
Total Bilirubin: 0.5 mg/dL (ref 0.3–1.2)
Total Protein: 7.9 g/dL (ref 6.0–8.3)

## 2012-01-19 LAB — CBC WITH DIFFERENTIAL/PLATELET
Basophils Absolute: 0 10*3/uL (ref 0.0–0.1)
Basophils Relative: 0 % (ref 0–1)
HCT: 38.5 % (ref 36.0–46.0)
MCHC: 34 g/dL (ref 30.0–36.0)
Monocytes Absolute: 0.7 10*3/uL (ref 0.1–1.0)
Neutro Abs: 4.3 10*3/uL (ref 1.7–7.7)
Neutrophils Relative %: 57 % (ref 43–77)
RDW: 12.3 % (ref 11.5–15.5)

## 2012-01-19 LAB — POCT I-STAT TROPONIN I

## 2012-01-19 MED ORDER — GLIPIZIDE 5 MG PO TABS: 5.0000 mg | ORAL_TABLET | Freq: Two times a day (BID) | ORAL | Status: DC

## 2012-01-19 NOTE — ED Notes (Addendum)
Pt in c/o bilateral arm pain and tingling, also in shoulder blades, episodes of chest pain this am, dizziness today and nausea. Pt denies pain in chest at this time, just pain radiating down arms. Pt states symptoms have been intermittent and started yesterday, pain increased in left arm today. Pt also reports cough over last week.

## 2012-01-19 NOTE — ED Provider Notes (Signed)
History     CSN: [id number]  Arrival date & time 01/19/12  1514   First MD Initiated Contact with Patient 01/19/12 1759      Chief Complaint  Patient presents with  . Dizziness  . Arm Pain  . Chest Pain    (Consider location/radiation/quality/duration/timing/severity/associated sxs/prior treatment) HPI Comments: Sabrina Brooks is a 51 y.o. Female who presents for evaluation of left arm tingling. Started today. No trauma. Also, has occasional pain in her left arm that comes and goes, today. She's not had this previously. She's been off her metformin for 6 months, because it causes "diarrhea". She denies neck or shoulder pain. No weakness, nausea, or vomiting. She works as a Financial risk analyst. She is dizzy, feeling, earlier today. That resolved spontaneously. There are no known modifying factors.  Patient is a 51 y.o. female presenting with arm pain and chest pain. The history is provided by the patient.  Arm Pain Associated symptoms include chest pain.  Chest Pain     Past Medical History  Diagnosis Date  . Asthma   . Hypertension   . Diabetes mellitus   . Chest pain   . Obesity   . Palpitation     Past Surgical History  Procedure Date  . Ectopic pregnancy surgery 1990  . Ectopic pregnancy surgery     History reviewed. No pertinent family history.  History  Substance Use Topics  . Smoking status: Never Smoker   . Smokeless tobacco: Never Used  . Alcohol Use: Yes     Comment: social drinker    OB History    Grav Para Term Preterm Abortions TAB SAB Ect Mult Living                  Review of Systems  Cardiovascular: Positive for chest pain.  All other systems reviewed and are negative.    Allergies  Review of patient's allergies indicates no known allergies.  Home Medications   Current Outpatient Rx  Name  Route  Sig  Dispense  Refill  . ASPIRIN 325 MG PO TABS   Oral   Take 325 mg by mouth daily.         Marland Kitchen METFORMIN HCL 500 MG PO TABS   Oral   Take 1  tablet (500 mg total) by mouth 2 (two) times daily with a meal.   60 tablet   0   . GLIPIZIDE 5 MG PO TABS   Oral   Take 1 tablet (5 mg total) by mouth 2 (two) times daily before a meal.   30 tablet   0     BP 122/83  Pulse 79  Temp 98.5 F (36.9 C) (Oral)  Resp 20  SpO2 98%  LMP 01/09/2012  Physical Exam  Nursing note and vitals reviewed. Constitutional: She is oriented to person, place, and time. She appears well-developed and well-nourished.  HENT:  Head: Normocephalic and atraumatic.  Eyes: Conjunctivae normal and EOM are normal. Pupils are equal, round, and reactive to light.  Neck: Normal range of motion and phonation normal. Neck supple.  Cardiovascular: Normal rate, regular rhythm and intact distal pulses.   Pulmonary/Chest: Effort normal and breath sounds normal. She exhibits no tenderness.  Abdominal: Soft. She exhibits no distension. There is no tenderness. There is no guarding.  Musculoskeletal: Normal range of motion.  Neurological: She is alert and oriented to person, place, and time. She has normal strength. No cranial nerve deficit. She exhibits normal muscle tone. Coordination normal.  No altered light touch, sensation of the arms or legs  Skin: Skin is warm and dry.  Psychiatric: She has a normal mood and affect. Her behavior is normal. Judgment and thought content normal.    ED Course  Procedures (including critical care time)     Date: 11/27/2011  Rate: 96  Rhythm: normal sinus rhythm  QRS Axis: normal  PR and QT Intervals: normal  ST/T Wave abnormalities: nonspecific T wave changes  PR and QRS Conduction Disutrbances:none  Narrative Interpretation:   Old EKG Reviewed: unchanged   Labs Reviewed  COMPREHENSIVE METABOLIC PANEL - Abnormal; Notable for the following:    Glucose, Bld 213 (*)     All other components within normal limits  CBC WITH DIFFERENTIAL  POCT I-STAT TROPONIN I   Dg Chest 2 View  01/19/2012  *RADIOLOGY REPORT*   Clinical Data: Dizziness, chest pain  CHEST - 2 VIEW  Comparison: 02/05/2011  Findings: Cardiomediastinal silhouette is stable.  No acute infiltrate or pleural effusion.  No pulmonary edema.  Bony thorax is unremarkable.  IMPRESSION: No active disease.   Original Report Authenticated By: Natasha Mead, M.D.    Nursing notes, applicable records and vitals reviewed.  Radiologic Images/Reports reviewed.   1. Paresthesia   2. Hyperglycemia       MDM  Nonspecific numbness, with normal neurologic exam. She has mild, hyperglycemia, without ketosis. She is intolerant of metformin so will start another agent. Doubt metabolic instability, serious bacterial infection or impending vascular collapse; the patient is stable for discharge.    Plan: Home Medications- Glipizide; Home Treatments- Fluids; Recommended follow up- PCP of choice 2-3 weeks         Flint Melter, MD 01/19/12 [phone]

## 2013-04-28 ENCOUNTER — Emergency Department (HOSPITAL_COMMUNITY)
Admission: EM | Admit: 2013-04-28 | Discharge: 2013-04-28 | Disposition: A | Discharge status: Home / Self Care | Payer: BC Managed Care – PPO | Attending: Emergency Medicine | Admitting: Emergency Medicine

## 2013-04-28 ENCOUNTER — Encounter (HOSPITAL_COMMUNITY): Payer: Self-pay | Admitting: Emergency Medicine

## 2013-04-28 DIAGNOSIS — E119 Type 2 diabetes mellitus without complications: Secondary | ICD-10-CM

## 2013-04-28 DIAGNOSIS — R739 Hyperglycemia, unspecified: Secondary | ICD-10-CM

## 2013-04-28 DIAGNOSIS — Z91199 Patient's noncompliance with other medical treatment and regimen due to unspecified reason: Secondary | ICD-10-CM | POA: Insufficient documentation

## 2013-04-28 DIAGNOSIS — E669 Obesity, unspecified: Secondary | ICD-10-CM | POA: Insufficient documentation

## 2013-04-28 DIAGNOSIS — Z792 Long term (current) use of antibiotics: Secondary | ICD-10-CM | POA: Insufficient documentation

## 2013-04-28 DIAGNOSIS — I10 Essential (primary) hypertension: Secondary | ICD-10-CM | POA: Insufficient documentation

## 2013-04-28 DIAGNOSIS — J45909 Unspecified asthma, uncomplicated: Secondary | ICD-10-CM | POA: Insufficient documentation

## 2013-04-28 DIAGNOSIS — Z9119 Patient's noncompliance with other medical treatment and regimen: Secondary | ICD-10-CM | POA: Insufficient documentation

## 2013-04-28 LAB — I-STAT CHEM 8, ED
BUN: 16 mg/dL (ref 6–23)
CHLORIDE: 98 meq/L (ref 96–112)
CREATININE: 0.7 mg/dL (ref 0.50–1.10)
Calcium, Ion: 1.23 mmol/L (ref 1.12–1.23)
Glucose, Bld: 390 mg/dL — ABNORMAL HIGH (ref 70–99)
HCT: 38 % (ref 36.0–46.0)
HEMOGLOBIN: 12.9 g/dL (ref 12.0–15.0)
POTASSIUM: 4.3 meq/L (ref 3.7–5.3)
SODIUM: 134 meq/L — AB (ref 137–147)
TCO2: 28 mmol/L (ref 0–100)

## 2013-04-28 LAB — CBC
HCT: 37.5 % (ref 36.0–46.0)
Hemoglobin: 13.1 g/dL (ref 12.0–15.0)
MCH: 29.9 pg (ref 26.0–34.0)
MCHC: 34.9 g/dL (ref 30.0–36.0)
MCV: 85.6 fL (ref 78.0–100.0)
PLATELETS: 250 10*3/uL (ref 150–400)
RBC: 4.38 MIL/uL (ref 3.87–5.11)
RDW: 12.3 % (ref 11.5–15.5)
WBC: 6.9 10*3/uL (ref 4.0–10.5)

## 2013-04-28 LAB — COMPREHENSIVE METABOLIC PANEL
ALBUMIN: 3.9 g/dL (ref 3.5–5.2)
ALT: 20 U/L (ref 0–35)
AST: 19 U/L (ref 0–37)
Alkaline Phosphatase: 79 U/L (ref 39–117)
BUN: 17 mg/dL (ref 6–23)
CALCIUM: 10 mg/dL (ref 8.4–10.5)
CO2: 26 mEq/L (ref 19–32)
CREATININE: 0.52 mg/dL (ref 0.50–1.10)
Chloride: 96 mEq/L (ref 96–112)
GFR calc Af Amer: >90 mL/min (ref >90)
Glucose, Bld: 324 mg/dL — ABNORMAL HIGH (ref 70–99)
Potassium: 4 mEq/L (ref 3.7–5.3)
SODIUM: 136 meq/L — AB (ref 137–147)
TOTAL PROTEIN: 7.6 g/dL (ref 6.0–8.3)
Total Bilirubin: 0.5 mg/dL (ref 0.3–1.2)

## 2013-04-28 LAB — CBG MONITORING, ED
Glucose-Capillary: 334 mg/dL — ABNORMAL HIGH (ref 70–99)
Glucose-Capillary: 295 mg/dL — ABNORMAL HIGH (ref 70–99)

## 2013-04-28 MED ORDER — METFORMIN HCL 500 MG PO TABS
500.0000 mg | ORAL_TABLET | Freq: Once | ORAL | Status: AC
  Administered 2013-04-28: 500 mg via ORAL
  Filled 2013-04-28: qty 1

## 2013-04-28 MED ORDER — SODIUM CHLORIDE 0.9 % IV BOLUS (SEPSIS)
1000.0000 mL | Freq: Once | INTRAVENOUS | Status: AC
  Administered 2013-04-28: 1000 mL via INTRAVENOUS

## 2013-04-28 MED ORDER — ACETAMINOPHEN 325 MG PO TABS
650.0000 mg | ORAL_TABLET | Freq: Once | ORAL | Status: AC
  Administered 2013-04-28: 650 mg via ORAL
  Filled 2013-04-28: qty 2

## 2013-04-28 MED ORDER — SODIUM CHLORIDE 0.9 % IV BOLUS (SEPSIS)
1000.0000 mL | Freq: Once | INTRAVENOUS | Status: DC
  Administered 2013-04-28: 1000 mL via INTRAVENOUS

## 2013-04-28 MED ORDER — METFORMIN HCL 500 MG PO TABS: 500.0000 mg | ORAL_TABLET | Freq: Two times a day (BID) | ORAL | Status: DC

## 2013-04-28 NOTE — ED Notes (Signed)
Pt here from work with c/o high blood sugar , pt has not had her metformin for over a year due to not having insurance

## 2013-04-28 NOTE — Discharge Instructions (Signed)
°Emergency Department Resource Guide °1) Find a Doctor and Pay Out of Pocket °Although you won't have to find out who is covered by your insurance plan, it is a good idea to ask around and get recommendations. You will then need to call the office and see if the doctor you have chosen will accept you as a new patient and what types of options they offer for patients who are self-pay. Some doctors offer discounts or will set up payment plans for their patients who do not have insurance, but you will need to ask so you aren't surprised when you get to your appointment. ° °2) Contact Your Local Health Department °Not all health departments have doctors that can see patients for sick visits, but many do, so it is worth a call to see if yours does. If you don't know where your local health department is, you can check in your phone book. The CDC also has a tool to help you locate your state's health department, and many state websites also have listings of all of their local health departments. ° °3) Find a Walk-in Clinic °If your illness is not likely to be very severe or complicated, you may want to try a walk in clinic. These are popping up all over the country in pharmacies, drugstores, and shopping centers. They're usually staffed by nurse practitioners or physician assistants that have been trained to treat common illnesses and complaints. They're usually fairly quick and inexpensive. However, if you have serious medical issues or chronic medical problems, these are probably not your best option. ° °No Primary Care Doctor: °- Call [facility] at  832-8000 - they can help you locate a primary care doctor that  accepts your insurance, provides certain services, etc. °- Physician Referral Service- 1-800-533-3463 ° °Chronic Pain Problems: °Organization         Address  Phone   Notes  °[facility]  (336) 297-2271 Patients need to be referred by their primary care doctor.  ° °Medication  Assistance: °Organization         Address  Phone   Notes  °[place] [facility] [address] °[place], [place] [address] (336) 641-8030 --Must be a resident of [place] °-- Must have NO insurance coverage whatsoever (no Medicaid/ Medicare, etc.) °-- The pt. MUST have a primary care doctor that directs their care regularly and follows them in the community °  °[facility]  (866) 331-1348   °[employer]  (888) 892-1162   ° °Agencies that provide inexpensive medical care: °Organization         Address  Phone   Notes  °[facility]  (336) 832-8035   °[facility]    (336) 832-7272   °[facility] [address] °[place], [place] [address] (336) 832-4777   °[facility] [address], °[place] (336) 271-4999   °[facility]    (336) 373-0678   °[facility]    (336) 272-1050   °[facility] ° [address], [place] Phone:  (336) 832-4444, Fax:  (336) 832-4440 Hours of Operation:  9 am - 6 pm, M-F.  Also accepts Medicaid/Medicare and self-pay.  °[facility] ° [address], [place] Phone: (336) 832-3150, Fax: (336) 832-3151. Hours of Operation:  8:30 am - 5:30 pm, M-F.  Also accepts Medicaid and self-pay.  °[facility] [place] [address], [place] Phone: (336) 878-6027   °[facility] [address], [place], [place] (336)723-1848, Ext. 123 Mondays & Thursdays: 7-9 AM.  First 15 patients are seen on a first come, first serve basis. °  ° °Medicaid-accepting [place] Providers: ° °Organization         Address  Phone   Notes  °[facility] [address], [place] (336) 641-2100 Also accepts self-pay patients.  °[facility] [address], [place] ° (336) 856-9996   °[facility] [address], [place]  (336) 288-8857   °[facility] [address] [place] [address], [place] (336) 299-7000   °Veita Bland [address], [place]  ° (336) 373-1557 Only accepts [place] Access Medicaid patients after they have their name applied to their card.  ° °Self-Pay (no insurance) in [place]: ° °Organization         Address  Phone   Notes  °Sickle Cell Patients, [facility] [address], [place] (336) 832-1970   °[facility] [address], [place] (336) 832-4400   °[facility] ° [address], [place] (336) 992-4800   °[facility]/Dr. Osei-Bonsu ° [address] [place] [address], [place] or [address], [place] (336) 841-8500 Phone number for both [place] and [place] locations is the same.  °[facility] [address], [place] (336) 299-0000   °[facility] [address] [place] [address], [place] or [address] (336) 852-7530 °(336) 878-2260   °[facility] [address], [place] (336) 350-1642, phone; (336) 294-5005, fax Sees patients 1st and 3rd Saturday of every month.  Must not qualify for public or private insurance (i.e. Medicaid, Medicare, [place] Health Choice, Veterans' Benefits) • Household income should be no more than 200% of the poverty level •The clinic cannot treat you if you are pregnant or think you are pregnant • Sexually transmitted diseases are not treated at the clinic.  ° ° °Dental Care: °Organization         Address  Phone  Notes  °[place] [facility] [address], [place] (336) 641-6152 Accepts children up to age 21 who are enrolled in Medicaid or [place] Health Choice; pregnant women with a Medicaid card; and children who have applied for Medicaid or [place] Health Choice, but were declined, whose parents can pay a reduced fee at time of service.  °[place]  [facility] [place]  [address], [place] (336) 641-7733 Accepts children up to age 21 who are enrolled in Medicaid or [place] Health Choice; pregnant women with a Medicaid card; and children who have applied for Medicaid or [place] Health Choice, but were declined, whose parents can pay a reduced fee at time of service.  °[facility] ° [address], [place] (336) 641-4533 Patients are seen by appointment only. Walk-ins are not accepted. [facility] will see patients 18 years of age and older. °Monday - Tuesday (8am-5pm) °Most Wednesdays (8:30-5pm) °$30 per visit, cash only  °[facility] ° [address], [place] (336) 641-4533 Patients are seen by appointment only. Walk-ins are not accepted. [facility] will see patients 18 years of age and older. °One   Wednesday Evening (Monthly: Volunteer Based).  $30 per visit, cash only  °[facility]  (919) 537-3737 for adults; Children under age 4, call Graduate Pediatric Dentistry at (919) [phone]. Children aged 4-14, please call (919) [phone] to request a pediatric application. ° Dental services are provided in all areas of dental care including fillings, crowns and bridges, complete and partial dentures, implants, gum treatment, root canals, and extractions. Preventive care is also provided. Treatment is provided to both adults and children. °Patients are selected via a lottery and there is often a waiting list. °  °[facility] [address], °[place] ° (336) 763-8833 [email or web address] °  °[facility] [address], [place], [place] (336)723-1848, Ext. 123 Second and Fourth Thursday of each month, opens at 6:30 AM; Clinic ends at 9 AM.  Patients are seen on a first-come first-served basis, and a limited number are seen during each clinic.  ° °[facility] ° [address], [place], [place] (336) 723-7904    Eligibility Requirements °You must have lived in [place], or [place] counties for at least the last three months. °  You cannot be eligible for state or federal sponsored healthcare insurance, including Veterans Administration, Medicaid, or Medicare. °  You generally cannot be eligible for healthcare insurance through your employer.  °  How to apply: °Eligibility screenings are held every Tuesday and Wednesday afternoon from 1:00 pm until 4:00 pm. You do not need an appointment for the interview!  °[facility] [address], [place], [place] 336-631-2330   °[facility] [facility]  336-342-8273   °[facility]  336-703-3100   °[facility]  336-570-6415   ° °Behavioral Health Resources in the Community: °Intensive Outpatient Programs °Organization         Address  Phone  Notes  °[facility] [address], [place], [place] 336-878-6098   °[facility] [address], [place], [place] 336-832-9800   °[facility] [address], [place], [place] ° 336-882-2125   °[facility] [address],  °[place], [place] 1-800-853-5163 or 336-641-4981   °Substance Abuse Resources °Organization         Address  Phone  Notes  °[facility]  336-882-2125   °[facility]  336-784-9470   °[facility]  336-285-9073   °[facility]  336-845-3988   °Residential & Outpatient Substance Abuse Program  1-800-659-3381   °Psychological Services °Organization         Address  Phone  Notes  °[facility]  336- 832-9600   °[facility]  336- 378-7881   °[facility] [address], [place] 1-800-853-5163 or 336-641-4981   ° °Mobile Crisis Teams °Organization         Address  Phone  Notes  °[facility], Mobile Crisis Care Unit  1-877-626-1772   °Assertive °[facility] ° [address]  [place], [place] 336-834-9664   °Sharon DeEsch [address] ° [place] 336-554-5454   ° °Self-Help/Support Groups °Organization         Address  Phone             Notes  °[facility]  - variety of support groups  336- 373-1402 Call for more information  °Narcotics Anonymous (NA), [facility] [address], °[place]  2 meetings at this location  ° °  Residential Treatment Programs °Organization         Address  Phone  Notes  °[facility] [address],    °[place]  1-866-801-8205   °[facility] ° [address], [place], [place] 704-293-8524   °[facility] [address], [place] 336-845-3988 Admissions: 8am-3pm M-F  °[facility] [address]    °[place], [place] 336-841-1104   °[facility] [address], [place], [place] 336-379-7146   °[facility] [address]  °[place], [place] 336-285-9073   °[facility] Intensive Outpatient [address], [place], [place] 336-852-3033   °[facility] [address]  °[place], [place] 1-877-615-2722 or 336-784-9470   °[facility] [address] [place], [place] 336-227-7417 Accepts Medicaid  °[facility] [address]  °[place] 1-800-659-3381 Substance Abuse/Addiction Treatment  ° °[place] Behavioral Health Resources °Organization         Address  Phone  Notes  °[facility]  (888) 581-9988   °Julie Brannon, PhD [address] [place], [place]   (336) 349-5553 or (336) 951-0000   °[facility]   [address] °[place], [place] (336) 349-4454   °[facility] [address], [place], [place] (336) 342-8316 Insurance/Medicaid/sponsorship through [facility]  °[facility] [address]                                    [place], [place] (336) 342-8316 Therapy/tele-psych/case    °[facility] [address]  ° [place], [place] (336) 349-2233    °Dr. Arfeen  (336) 349-4544   °[facility] [place]  [employer] [place] [facility] 1) [address], [place] °2) [address], [place] °3)  [address], [place] (336) 349-3220 °(336) 342-7768 ° °(336) 342-8140   °[place] [facility] (336) 342-1394 or (336) 342-3537 (After Hours)    ° ° °

## 2013-04-29 NOTE — ED Provider Notes (Signed)
CSN: [id number]     Arrival date & [id number] 04/28/13  1521 History   First MD Initiated Contact with Patient 04/28/13 2035     Chief Complaint  Patient presents with  . Hyperglycemia    HPI  Sabrina Brooks is a 53 y.o. female with a history of HTN and DM 2 who presents complaining of hyperglycemia.  She checked her blood sugar at home, and it was over 300.  She is a known diabetic, (DM 2), and has been prescribed metformin in the past.  However, she has not taken her medication for over a year, because she has not had insurance.    She is not having any symptoms.  No fever, chest pain, SOB, abdominal pain, nausea, vomiting, diarrhea, dysuria, vaginal bleeding or discharge, polydipsia, or polyuria.     Past Medical History  Diagnosis Date  . Asthma   . Hypertension   . Diabetes mellitus   . Chest pain   . Obesity   . Palpitation    Past Surgical History  Procedure Laterality Date  . Ectopic pregnancy surgery  1990  . Ectopic pregnancy surgery     History reviewed. No pertinent family history. History  Substance Use Topics  . Smoking status: Never Smoker   . Smokeless tobacco: Never Used  . Alcohol Use: Yes     Comment: social drinker   OB History   Grav Para Term Preterm Abortions TAB SAB Ect Mult Living                 Review of Systems  Constitutional: Negative for fever, chills and diaphoresis.  HENT: Negative for congestion and rhinorrhea.   Respiratory: Negative for cough, shortness of breath and wheezing.   Cardiovascular: Negative for chest pain and leg swelling.  Gastrointestinal: Negative for nausea, vomiting, abdominal pain and diarrhea.  Genitourinary: Negative for dysuria, urgency, frequency, flank pain, vaginal bleeding, vaginal discharge and difficulty urinating.  Musculoskeletal: Negative for neck pain and neck stiffness.  Skin: Negative for rash.  Neurological: Negative for weakness, numbness and headaches.  All other systems reviewed and are  negative.      Allergies  Review of patient's allergies indicates no known allergies.  Home Medications   Current Outpatient Rx  Name  Route  Sig  Dispense  Refill  . amoxicillin (AMOXIL) 500 MG capsule   Oral   Take 500 mg by mouth 3 (three) times daily.         Marland Kitchen. HYDROcodone-acetaminophen (NORCO) 7.5-325 MG per tablet   Oral   Take 1 tablet by mouth every 6 (six) hours as needed for moderate pain.         . metFORMIN (GLUCOPHAGE) 500 MG tablet   Oral   Take 1 tablet (500 mg total) by mouth 2 (two) times daily with a meal.   120 tablet   0    BP 117/72  Pulse 85  Temp(Src) 98.2 F (36.8 C) (Oral)  Resp 16  SpO2 100% Physical Exam  Nursing note and vitals reviewed. Constitutional: She is oriented to person, place, and time. She appears well-developed and well-nourished. No distress.  HENT:  Head: Normocephalic and atraumatic.  Mouth/Throat: Oropharynx is clear and moist.  Eyes: Conjunctivae and EOM are normal. Pupils are equal, round, and reactive to light. No scleral icterus.  Neck: Normal range of motion. Neck supple. No JVD present.  Cardiovascular: Normal rate, regular rhythm, normal heart sounds and intact distal pulses.  Exam reveals no gallop and no  friction rub.   No murmur heard. Pulmonary/Chest: Effort normal and breath sounds normal. No respiratory distress. She has no wheezes. She has no rales.  Abdominal: Soft. Bowel sounds are normal. She exhibits no distension. There is no tenderness. There is no rebound and no guarding.  Musculoskeletal: She exhibits no edema.  Neurological: She is alert and oriented to person, place, and time. No cranial nerve deficit. She exhibits normal muscle tone. Coordination normal.  Skin: Skin is warm and dry. She is not diaphoretic.    ED Course  Procedures (including critical care time) Labs Review Labs Reviewed  COMPREHENSIVE METABOLIC PANEL - Abnormal; Notable for the following:    Sodium 136 (*)    Glucose, Bld  324 (*)    All other components within normal limits  CBG MONITORING, ED - Abnormal; Notable for the following:    Glucose-Capillary 334 (*)    All other components within normal limits  I-STAT CHEM 8, ED - Abnormal; Notable for the following:    Sodium 134 (*)    Glucose, Bld 390 (*)    All other components within normal limits  CBG MONITORING, ED - Abnormal; Notable for the following:    Glucose-Capillary 295 (*)    All other components within normal limits  CBC   Imaging Review No results found.   EKG Interpretation None      MDM   53 yo type 2 diabetic, non-compliant with metformin for past year, here with hyperglycemia, 300s.  Asymptomatic.  Wants to reestablish primary care and start her metformin again.  Her exam is unremarkable.  Gave 1 L IVF bolus, with improvement of glucose from 390 to 290.  Gave a dose of metformin in the ED.  Wrote a Advertising account executive for metformin, and gave her resource guide to establish primary care.  Gave her a tylenol when she c/o mil HA.  Stable for discharge.  Gave return precautions.  No DKA by labs.  Normal creatinine.  Medications  sodium chloride 0.9 % bolus 1,000 mL (0 mLs Intravenous Stopped 04/28/13 2243)  acetaminophen (TYLENOL) tablet 650 mg (650 mg Oral Given 04/28/13 2215)  metFORMIN (GLUCOPHAGE) tablet 500 mg (500 mg Oral Given 04/28/13 2247)     Final diagnoses:  DM (diabetes mellitus)  Hyperglycemia      Toney Sang, MD 05/01/13 1251

## 2013-05-02 NOTE — ED Provider Notes (Signed)
I saw and evaluated the patient, reviewed the resident's note and I agree with the findings and plan.   EKG Interpretation None      Pt with hx dm, non compliant w rx. Pt alert, nad, abd soft nt. Labs. Ivf.   Suzi RootsKevin E Harpreet Pompey, MD 05/02/13 1056

## 2013-05-15 ENCOUNTER — Encounter (HOSPITAL_COMMUNITY): Payer: Self-pay | Admitting: Emergency Medicine

## 2013-05-15 ENCOUNTER — Emergency Department (INDEPENDENT_AMBULATORY_CARE_PROVIDER_SITE_OTHER)
Admission: EM | Admit: 2013-05-15 | Discharge: 2013-05-15 | Disposition: A | Discharge status: Home / Self Care | Payer: BC Managed Care – PPO | Source: Home / Self Care | Attending: Emergency Medicine | Admitting: Emergency Medicine

## 2013-05-15 DIAGNOSIS — K0889 Other specified disorders of teeth and supporting structures: Secondary | ICD-10-CM

## 2013-05-15 DIAGNOSIS — K044 Acute apical periodontitis of pulpal origin: Secondary | ICD-10-CM

## 2013-05-15 DIAGNOSIS — K089 Disorder of teeth and supporting structures, unspecified: Secondary | ICD-10-CM

## 2013-05-15 DIAGNOSIS — K047 Periapical abscess without sinus: Secondary | ICD-10-CM

## 2013-05-15 DIAGNOSIS — K0510 Chronic gingivitis, plaque induced: Secondary | ICD-10-CM

## 2013-05-15 MED ORDER — CLINDAMYCIN HCL 300 MG PO CAPS: 300.0000 mg | ORAL_CAPSULE | Freq: Three times a day (TID) | ORAL | Status: DC

## 2013-05-15 MED ORDER — CHLORHEXIDINE GLUCONATE 0.12 % MT SOLN: 15.0000 mL | Freq: Two times a day (BID) | OROMUCOSAL | Status: DC

## 2013-05-15 MED ORDER — TRAMADOL HCL 50 MG PO TABS: 50.0000 mg | ORAL_TABLET | Freq: Four times a day (QID) | ORAL | Status: DC | PRN

## 2013-05-15 NOTE — ED Provider Notes (Signed)
CSN: [id number]     Arrival date & time 05/15/13  [id number] History   First MD Initiated Contact with Patient 05/15/13 [phone]     Chief Complaint  Patient presents with  . Dental Pain   (Consider location/radiation/quality/duration/timing/severity/associated sxs/prior Treatment) HPI Comments: 53 year old female with a history significant for type 2 diabetes presents complaining of dental pain. She has pain in her top and bottom jaw and the entire right side of her mouth. She saw a dentist 3 months ago he treated her for gingivitis and possible dental abscess with amoxicillin and Norco. She was advised to come back when she finished the antibiotics for further evaluation and treatment. She did not go back, and now the pain has returned. This is exactly the same as the pain was previously. She denies any systemic symptoms. No trauma.  Patient is a 53 y.o. female presenting with tooth pain.  Dental Pain Associated symptoms: no fever     Past Medical History  Diagnosis Date  . Asthma   . Hypertension   . Diabetes mellitus   . Chest pain   . Obesity   . Palpitation    Past Surgical History  Procedure Laterality Date  . Ectopic pregnancy surgery  1990  . Ectopic pregnancy surgery     History reviewed. No pertinent family history. History  Substance Use Topics  . Smoking status: Never Smoker   . Smokeless tobacco: Never Used  . Alcohol Use: Yes     Comment: social drinker   OB History   Grav Para Term Preterm Abortions TAB SAB Ect Mult Living                 Review of Systems  Constitutional: Negative for fever and chills.  HENT: Positive for dental problem.   Respiratory: Negative for shortness of breath.   Cardiovascular: Negative for chest pain.  Gastrointestinal: Negative for nausea and vomiting.  All other systems reviewed and are negative.    Allergies  Review of patient's allergies indicates no known allergies.  Home Medications   Current Outpatient Rx  Name  Route   Sig  Dispense  Refill  . amoxicillin (AMOXIL) 500 MG capsule   Oral   Take 500 mg by mouth 3 (three) times daily.         . metFORMIN (GLUCOPHAGE) 500 MG tablet   Oral   Take 1 tablet (500 mg total) by mouth 2 (two) times daily with a meal.   120 tablet   0   . chlorhexidine (PERIDEX) 0.12 % solution   Mouth Rinse   15 mLs by Mouth Rinse route 2 (two) times daily.   240 mL   0   . clindamycin (CLEOCIN) 300 MG capsule   Oral   Take 1 capsule (300 mg total) by mouth 3 (three) times daily.   42 capsule   0   . HYDROcodone-acetaminophen (NORCO) 7.5-325 MG per tablet   Oral   Take 1 tablet by mouth every 6 (six) hours as needed for moderate pain.         . traMADol (ULTRAM) 50 MG tablet   Oral   Take 1 tablet (50 mg total) by mouth every 6 (six) hours as needed.   20 tablet   0    BP 129/79  Pulse 78  Temp(Src) 98.4 F (36.9 C) (Oral)  Resp 16  SpO2 100% Physical Exam  Nursing note and vitals reviewed. Constitutional: She is oriented to person, place, and time. Vital signs  are normal. She appears well-developed and well-nourished. No distress.  HENT:  Head: Normocephalic and atraumatic.  Mouth/Throat: Oropharynx is clear and moist and mucous membranes are normal. No trismus in the jaw. Abnormal dentition. Dental caries (multiple dental caries with swollen erythematous gums and gingivitis on the top and bottom right side) present. No dental abscesses.  Pulmonary/Chest: Effort normal. No respiratory distress.  Lymphadenopathy:       Head (right side): No submandibular and no tonsillar adenopathy present.       Head (left side): No submandibular and no tonsillar adenopathy present.    She has no cervical adenopathy.  Neurological: She is alert and oriented to person, place, and time. She has normal strength. Coordination normal.  Skin: Skin is warm and dry. No rash noted. She is not diaphoretic.  Psychiatric: She has a normal mood and affect. Judgment normal.    ED  Course  Procedures (including critical care time) Labs Review Labs Reviewed - No data to display Imaging Review No results found.   MDM   1. Toothache   2. Dental infection   3. Gingivitis    Treating with clindamycin, Peridex mouth rinse, tramadol when necessary. She will go back to the dentist in one week.  Meds ordered this encounter  Medications  . clindamycin (CLEOCIN) 300 MG capsule    Sig: Take 1 capsule (300 mg total) by mouth 3 (three) times daily.    Dispense:  42 capsule    Refill:  0    Order Specific Question:  Supervising Provider    Answer:  Lorenz Coaster, DAVID C [id number]  . chlorhexidine (PERIDEX) 0.12 % solution    Sig: 15 mLs by Mouth Rinse route 2 (two) times daily.    Dispense:  240 mL    Refill:  0    Order Specific Question:  Supervising Provider    Answer:  Lorenz Coaster, DAVID C [id number]  . traMADol (ULTRAM) 50 MG tablet    Sig: Take 1 tablet (50 mg total) by mouth every 6 (six) hours as needed.    Dispense:  20 tablet    Refill:  0    Order Specific Question:  Supervising Provider    Answer:  Lorenz Coaster, DAVID C [[id number]]       Graylon Good, PA-C 05/15/13 [phone]

## 2013-05-15 NOTE — ED Provider Notes (Signed)
Medical screening examination/treatment/procedure(s) were performed by non-physician practitioner and as supervising physician I was immediately available for consultation/collaboration.  Leslee Homeavid Safia Panzer, M.D.  Reuben Likesavid C Caetano Oberhaus, MD 05/15/13 [phone]

## 2013-05-15 NOTE — ED Notes (Signed)
C/o right sided dental pain x 3 wks.  States "i was put on an antibiotic but did not finish".

## 2013-05-15 NOTE — Discharge Instructions (Signed)
Dental Care and Dentist Visits °Dental care supports good overall health. Regular dental visits can also help you avoid dental pain, bleeding, infection, and other more serious health problems in the future. It is important to keep the mouth healthy because diseases in the teeth, gums, and other oral tissues can spread to other areas of the body. Some problems, such as diabetes, heart disease, and pre-term labor have been associated with poor oral health.  °See your dentist every 6 months. If you experience emergency problems such as a toothache or broken tooth, go to the dentist right away. If you see your dentist regularly, you may catch problems early. It is easier to be treated for problems in the early stages.  °WHAT TO EXPECT AT A DENTIST VISIT  °Your dentist will look for many common oral health problems and recommend proper treatment. At your regular dental visit, you can expect: °· Gentle cleaning of the teeth and gums. This includes scraping and polishing. This helps to remove the sticky substance around the teeth and gums (plaque). Plaque forms in the mouth shortly after eating. Over time, plaque hardens on the teeth as tartar. If tartar is not removed regularly, it can cause problems. Cleaning also helps remove stains. °· Periodic X-rays. These pictures of the teeth and supporting bone will help your dentist assess the health of your teeth. °· Periodic fluoride treatments. Fluoride is a natural mineral shown to help strengthen teeth. Fluoride treatment involves applying a fluoride gel or varnish to the teeth. It is most commonly done in children. °· Examination of the mouth, tongue, jaws, teeth, and gums to look for any oral health problems, such as: °· Cavities (dental caries). This is decay on the tooth caused by plaque, sugar, and acid in the mouth. It is best to catch a cavity when it is small. °· Inflammation of the gums caused by plaque buildup (gingivitis). °· Problems with the mouth or malformed  or misaligned teeth. °· Oral cancer or other diseases of the soft tissues or jaws.  °KEEP YOUR TEETH AND GUMS HEALTHY °For healthy teeth and gums, follow these general guidelines as well as your dentist's specific advice: °· Have your teeth professionally cleaned at the dentist every 6 months. °· Brush twice daily with a fluoride toothpaste. °· Floss your teeth daily.  °· Ask your dentist if you need fluoride supplements, treatments, or fluoride toothpaste. °· Eat a healthy diet. Reduce foods and drinks with added sugar. °· Avoid smoking. °TREATMENT FOR ORAL HEALTH PROBLEMS °If you have oral health problems, treatment varies depending on the conditions present in your teeth and gums. °· Your caregiver will most likely recommend good oral hygiene at each visit. °· For cavities, gingivitis, or other oral health disease, your caregiver will perform a procedure to treat the problem. This is typically done at a separate appointment. Sometimes your caregiver will refer you to another dental specialist for specific tooth problems or for surgery. °SEEK IMMEDIATE DENTAL CARE IF: °· You have pain, bleeding, or soreness in the gum, tooth, jaw, or mouth area. °· A permanent tooth becomes loose or separated from the gum socket. °· You experience a blow or injury to the mouth or jaw area. °Document Released: 10/08/2010 Document Revised: 04/20/2011 Document Reviewed: 10/08/2010 °ExitCare® Patient Information ©2014 [employer]. ° °Dental Pain °A tooth ache may be caused by cavities (tooth decay). Cavities expose the nerve of the tooth to air and hot or cold temperatures. It may come from an infection or abscess (also called a   boil or furuncle) around your tooth. It is also often caused by dental caries (tooth decay). This causes the pain you are having. DIAGNOSIS  Your caregiver can diagnose this problem by exam. TREATMENT   If caused by an infection, it may be treated with medications which kill germs (antibiotics) and pain  medications as prescribed by your caregiver. Take medications as directed.  Only take over-the-counter or prescription medicines for pain, discomfort, or fever as directed by your caregiver.  Whether the tooth ache today is caused by infection or dental disease, you should see your dentist as soon as possible for further care. SEEK MEDICAL CARE IF: The exam and treatment you received today has been provided on an emergency basis only. This is not a substitute for complete medical or dental care. If your problem worsens or new problems (symptoms) appear, and you are unable to meet with your dentist, call or return to this location. SEEK IMMEDIATE MEDICAL CARE IF:   You have a fever.  You develop redness and swelling of your face, jaw, or neck.  You are unable to open your mouth.  You have severe pain uncontrolled by pain medicine. MAKE SURE YOU:   Understand these instructions.  Will watch your condition.  Will get help right away if you are not doing well or get worse. Document Released: 01/26/2005 Document Revised: 04/20/2011 Document Reviewed: 09/14/2007 [facility] Patient Information 2014 [place], [place].  Gum Disease Gum disease is an infection of the tissues that surround and support the teeth (periodontium). This includes the gums, connective tissue fibers (ligaments), and the thickened ridges of the tooth bone (sockets). The disease is caused by germs (bacteria) that grow in soft deposits (plaque) on the teeth. This results in redness, soreness, and swelling (inflammation). This inflammation causes the gums to bleed. If left untreated, it can lead to damage of the tissues and supportive bone. Although bacteria are known as the major cause of gum disease, other risk factors include tobacco use, diabetes, certain medications, hormones, pregnancy, and genetic factors. SYMPTOMS   Gums that bleed easily.  Red or swollen gums.  Bad breath that does not go away.  Gums that have  pulled away from the teeth.  Loose or separating permanent teeth.  Painful chewing.  Changes in the way your teeth fit together. DIAGNOSIS  A thorough exam will be performed by a dentist to determine the presence and stage of gum disease. The stage is how far the gum disease has developed. TREATMENT  Treatment is based on the stages of gum disease. The stages include:  Mild. If it is caught early, conditions can improve by brushing and flossing properly.  Moderate. You may need special cleaning (scaling and root planing). This method removes plaque and hardened plaque (tartar) above and below the gum line. Medication may also be used to treat moderate gum disease.  Severe. This stage requires surgery of the gums and supporting bone. PREVENTION  You can prevent gum disease by:  Practicing good oral hygiene, including brushing and flossing properly.  Avoiding use of tobacco products.  Scheduling regular dental check-ups and cleanings.  Eating a well-balanced diet. SEEK IMMEDIATE DENTAL OR MEDICAL CARE IF:  You have fever over 102 F (38.9 C).  You have swelling of your face, neck, or jaw.  You are unable to open your mouth.  You have severe pain not controlled by pain medicine. Document Released: 07/16/2009 Document Revised: 10/21/2011 Document Reviewed: 07/16/2009 [facility] Patient Information 2014 [place]ExitCare, [place]LLC.

## 2013-05-31 ENCOUNTER — Emergency Department (HOSPITAL_COMMUNITY)
Admission: EM | Admit: 2013-05-31 | Discharge: 2013-05-31 | Disposition: A | Discharge status: Home / Self Care | Payer: BC Managed Care – PPO | Attending: Emergency Medicine | Admitting: Emergency Medicine

## 2013-05-31 ENCOUNTER — Encounter (HOSPITAL_COMMUNITY): Payer: Self-pay | Admitting: Emergency Medicine

## 2013-05-31 DIAGNOSIS — J111 Influenza due to unidentified influenza virus with other respiratory manifestations: Secondary | ICD-10-CM | POA: Insufficient documentation

## 2013-05-31 DIAGNOSIS — R638 Other symptoms and signs concerning food and fluid intake: Secondary | ICD-10-CM | POA: Insufficient documentation

## 2013-05-31 DIAGNOSIS — R000 Tachycardia, unspecified: Secondary | ICD-10-CM | POA: Insufficient documentation

## 2013-05-31 DIAGNOSIS — E669 Obesity, unspecified: Secondary | ICD-10-CM | POA: Insufficient documentation

## 2013-05-31 DIAGNOSIS — J45909 Unspecified asthma, uncomplicated: Secondary | ICD-10-CM | POA: Insufficient documentation

## 2013-05-31 DIAGNOSIS — E119 Type 2 diabetes mellitus without complications: Secondary | ICD-10-CM | POA: Insufficient documentation

## 2013-05-31 DIAGNOSIS — R197 Diarrhea, unspecified: Secondary | ICD-10-CM | POA: Insufficient documentation

## 2013-05-31 DIAGNOSIS — R6889 Other general symptoms and signs: Secondary | ICD-10-CM

## 2013-05-31 DIAGNOSIS — I10 Essential (primary) hypertension: Secondary | ICD-10-CM | POA: Insufficient documentation

## 2013-05-31 DIAGNOSIS — R509 Fever, unspecified: Secondary | ICD-10-CM

## 2013-05-31 DIAGNOSIS — Z79899 Other long term (current) drug therapy: Secondary | ICD-10-CM | POA: Insufficient documentation

## 2013-05-31 DIAGNOSIS — Z792 Long term (current) use of antibiotics: Secondary | ICD-10-CM | POA: Insufficient documentation

## 2013-05-31 LAB — BASIC METABOLIC PANEL
BUN: 12 mg/dL (ref 6–23)
CHLORIDE: 97 meq/L (ref 96–112)
CO2: 25 meq/L (ref 19–32)
CREATININE: 0.71 mg/dL (ref 0.50–1.10)
Calcium: 10 mg/dL (ref 8.4–10.5)
GFR calc Af Amer: >90 mL/min (ref >90)
GFR calc non Af Amer: >90 mL/min (ref >90)
Glucose, Bld: 221 mg/dL — ABNORMAL HIGH (ref 70–99)
Potassium: 4.6 mEq/L (ref 3.7–5.3)
Sodium: 136 mEq/L — ABNORMAL LOW (ref 137–147)

## 2013-05-31 LAB — CBC
HCT: 36.6 % (ref 36.0–46.0)
HEMOGLOBIN: 12.5 g/dL (ref 12.0–15.0)
MCH: 29.7 pg (ref 26.0–34.0)
MCHC: 34.2 g/dL (ref 30.0–36.0)
MCV: 86.9 fL (ref 78.0–100.0)
PLATELETS: 243 10*3/uL (ref 150–400)
RBC: 4.21 MIL/uL (ref 3.87–5.11)
RDW: 12.8 % (ref 11.5–15.5)
WBC: 10.4 10*3/uL (ref 4.0–10.5)

## 2013-05-31 MED ORDER — IBUPROFEN 800 MG PO TABS: 800.0000 mg | ORAL_TABLET | Freq: Three times a day (TID) | ORAL | Status: DC

## 2013-05-31 MED ORDER — ONDANSETRON 4 MG PO TBDP
4.0000 mg | ORAL_TABLET | Freq: Once | ORAL | Status: AC
  Administered 2013-05-31: 4 mg via ORAL
  Filled 2013-05-31: qty 1

## 2013-05-31 MED ORDER — ONDANSETRON HCL 4 MG PO TABS: 4.0000 mg | ORAL_TABLET | Freq: Four times a day (QID) | ORAL | Status: DC

## 2013-05-31 MED ORDER — IBUPROFEN 800 MG PO TABS
800.0000 mg | ORAL_TABLET | Freq: Once | ORAL | Status: AC
  Administered 2013-05-31: 800 mg via ORAL
  Filled 2013-05-31: qty 1

## 2013-05-31 NOTE — ED Notes (Signed)
Pt reports body aches, chills, fever, nausea dry cough for a few days. Pt took tylenol pta for fever of 101.1. Respirations e/u. Skin warm and dry.

## 2013-05-31 NOTE — ED Provider Notes (Signed)
Medical screening examination/treatment/procedure(s) were performed by non-physician practitioner and as supervising physician I was immediately available for consultation/collaboration.   EKG Interpretation None        Charles B. Bernette MayersSheldon, MD 05/31/13 [phone]

## 2013-05-31 NOTE — ED Provider Notes (Signed)
CSN: [id number]     Arrival date & time 05/31/13  1527 History   First MD Initiated Contact with Patient 05/31/13 1721     Chief Complaint  Patient presents with  . Influenza     (Consider location/radiation/quality/duration/timing/severity/associated sxs/prior Treatment) HPI Comments: Patient is a 53 year old female with a past medical history of asthma, hypertension and diabetes who presents to the emergency department complaining of flulike symptoms x2 days. Patient states she has bodyaches, fever and chills, nausea and a dry cough. She took Tylenol yesterday. Today while she was at work her temperature was 101.1 when she took Tylenol. 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 Admits to associated diarrhea, 10 episodes of nonbloody diarrhea yesterday and 2 today. States she is nauseated but has not vomited. She has a decreased appetite. Denies abdominal pain or urinary changes.  Patient is a [age] y.o. female presenting with flu symptoms. The history is provided by the patient.  Influenza Presenting symptoms: cough, diarrhea, fever, myalgias and nausea   Associated symptoms: chills and nasal congestion     Past Medical History  Diagnosis Date  . Asthma   . Hypertension   . Diabetes mellitus   . Chest pain   . Obesity   . Palpitation    Past Surgical History  Procedure Laterality Date  . Ectopic pregnancy surgery  1990  . Ectopic pregnancy surgery     No family history on file. History  Substance Use Topics  . Smoking status: Never Smoker   . Smokeless tobacco: Never Used  . Alcohol Use: Yes     Comment: social drinker   OB History   Grav Para Term Preterm Abortions TAB SAB Ect Mult Living                 Review of Systems  Constitutional: Positive for fever and chills.  HENT: Positive for congestion.   Respiratory: Positive for cough.   Gastrointestinal: Positive for nausea and diarrhea.  Musculoskeletal: Positive for  arthralgias and myalgias.  All other systems reviewed and are negative.     Allergies  Review of patient's allergies indicates no known allergies.  Home Medications   Prior to Admission medications   Medication Sig Start Date End Date Taking? Authorizing Provider  acetaminophen (TYLENOL) 325 MG tablet Take 325 mg by mouth every 6 (six) hours as needed.   Yes Historical Provider, MD  clindamycin (CLEOCIN) 300 MG capsule Take 1 capsule (300 mg total) by mouth 3 (three) times daily. 05/15/13  Yes Graylon GoodZachary H Baker, PA-C  metFORMIN (GLUCOPHAGE) 500 MG tablet Take 1 tablet (500 mg total) by mouth 2 (two) times daily with a meal. 04/28/13 04/28/14 Yes Jerrid Pippin, MD   BP 116/70  Pulse 111  Temp(Src) 101.7 F (38.7 C) (Oral)  Resp 16  Ht 5\' 6"  (1.676 m)  Wt 211 lb (95.709 kg)  BMI 34.07 kg/m2  SpO2 99% Physical Exam  Nursing note and vitals reviewed. Constitutional: She is oriented to person, place, and time. She appears well-developed and well-nourished. No distress.  HENT:  Head: Normocephalic and atraumatic.  Nose: Mucosal edema present.  Mouth/Throat: Oropharynx is clear and moist.  Eyes: Conjunctivae are normal.  Neck: Normal range of motion. Neck supple.  Cardiovascular: Regular rhythm, normal heart sounds and intact distal pulses.  Tachycardia present.   Pulmonary/Chest: Effort normal and breath sounds normal. No respiratory distress. She has no wheezes. She has no rhonchi.  Abdominal: Soft. Bowel sounds are normal. There is no tenderness.  Musculoskeletal: Normal range of motion. She exhibits no edema.  Neurological: She is alert and oriented to person, place, and time.  Skin: Skin is warm and dry. She is not diaphoretic.  Psychiatric: She has a normal mood and affect. Her behavior is normal.    ED Course  Procedures (including critical care time) Labs Review Labs Reviewed  BASIC METABOLIC PANEL - Abnormal; Notable for the following:    Sodium 136 (*)    Glucose, Bld  221 (*)    All other components within normal limits  CBC    Imaging Review No results found.   EKG Interpretation None      MDM   Final diagnoses:  Flu-like symptoms  Fever   Pt presenting with flu-like symptoms. She appears in NAD. Tachycardic around 105-110 and febrile at 100.1. Labs obtained in triage prior to patient being seen, no acute findings. She is tolerating PO without difficulty. Discussed symptomatic treatment. Stable for d/c. Return precautions given. Patient states understanding of treatment care plan and is agreeable.    Trevor MaceRobyn M Albert, PA-C 05/31/13 [phone]

## 2013-12-14 ENCOUNTER — Other Ambulatory Visit (HOSPITAL_COMMUNITY)
Admission: RE | Admit: 2013-12-14 | Discharge: 2013-12-14 | Disposition: A | Discharge status: Home / Self Care | Payer: BC Managed Care – PPO | Source: Ambulatory Visit | Attending: Emergency Medicine | Admitting: Emergency Medicine

## 2013-12-14 ENCOUNTER — Encounter (HOSPITAL_COMMUNITY): Payer: Self-pay | Admitting: *Deleted

## 2013-12-14 ENCOUNTER — Emergency Department (INDEPENDENT_AMBULATORY_CARE_PROVIDER_SITE_OTHER)
Admission: EM | Admit: 2013-12-14 | Discharge: 2013-12-14 | Disposition: A | Discharge status: Home / Self Care | Payer: BC Managed Care – PPO | Source: Home / Self Care | Attending: Emergency Medicine | Admitting: Emergency Medicine

## 2013-12-14 DIAGNOSIS — N760 Acute vaginitis: Secondary | ICD-10-CM | POA: Diagnosis present

## 2013-12-14 DIAGNOSIS — Z113 Encounter for screening for infections with a predominantly sexual mode of transmission: Secondary | ICD-10-CM | POA: Diagnosis present

## 2013-12-14 DIAGNOSIS — K0401 Reversible pulpitis: Secondary | ICD-10-CM

## 2013-12-14 DIAGNOSIS — K040 Pulpitis: Secondary | ICD-10-CM

## 2013-12-14 LAB — GLUCOSE, CAPILLARY: Glucose-Capillary: 336 mg/dL — ABNORMAL HIGH (ref 70–99)

## 2013-12-14 LAB — POCT URINALYSIS DIP (DEVICE)
Bilirubin Urine: NEGATIVE
Glucose, UA: >=1000 mg/dL — AB
HGB URINE DIPSTICK: NEGATIVE
Ketones, ur: NEGATIVE mg/dL
Leukocytes, UA: NEGATIVE
Nitrite: NEGATIVE
PH: 7 (ref 5.0–8.0)
PROTEIN: NEGATIVE mg/dL
SPECIFIC GRAVITY, URINE: 1.015 (ref 1.005–1.030)
Urobilinogen, UA: 0.2 mg/dL (ref 0.0–1.0)

## 2013-12-14 LAB — POCT PREGNANCY, URINE: Preg Test, Ur: NEGATIVE

## 2013-12-14 MED ORDER — AMOXICILLIN 500 MG PO CAPS: 500.0000 mg | ORAL_CAPSULE | Freq: Three times a day (TID) | ORAL | Status: DC

## 2013-12-14 MED ORDER — KETOROLAC TROMETHAMINE 60 MG/2ML IM SOLN: 60.0000 mg | Freq: Once | INTRAMUSCULAR | Status: DC

## 2013-12-14 MED ORDER — FLUCONAZOLE 150 MG PO TABS: 150.0000 mg | ORAL_TABLET | Freq: Once | ORAL | Status: DC

## 2013-12-14 MED ORDER — HYDROCODONE-ACETAMINOPHEN 5-325 MG PO TABS: ORAL_TABLET | ORAL | Status: DC

## 2013-12-14 MED ORDER — METFORMIN HCL 500 MG PO TABS: 500.0000 mg | ORAL_TABLET | Freq: Two times a day (BID) | ORAL | Status: DC

## 2013-12-14 MED ORDER — METRONIDAZOLE 500 MG PO TABS: 500.0000 mg | ORAL_TABLET | Freq: Two times a day (BID) | ORAL | Status: DC

## 2013-12-14 NOTE — ED Notes (Signed)
Pt  Has  Two  Symptoms  She  Has  A  Vaginal discharge         White  In color  That  She  Attributes  To a   possible yeast    Infection  As well  As having a  Toothache

## 2013-12-14 NOTE — ED Provider Notes (Signed)
Chief Complaint   Dental Pain   History of Present Illness   Sabrina Brooks is a 53 year old female who presents with dental pain and vaginal discharge.  She has a 1 week history of pain in her right lower second molar.  It hurts to chew on that side, but she is able to open her mouth fully.  No difficulty breathing or swallowing.  She has a headache but no fever or chills.  She also has a 2 to 3 day history of vaginal discharge and itch.  She is a diabetic and has been off her meds for months.  She  Has some pelvic pain.   Review of Systems   Other than as noted above, the patient denies any of the following symptoms: Systemic:  No fever or chills GI:  No abdominal pain, nausea, vomiting, diarrhea, constipation, melena or hematochezia. GU:  No dysuria, frequency, urgency, hematuria, vaginal discharge, itching, or abnormal vaginal bleeding.  PMFSH   Past medical history, family history, social history, meds, and allergies were reviewed.  She is supposed to be on metformin, but has been off for months.   Physical Examination    Vital signs:  BP 141/94 mmHg  Pulse 95  Temp(Src) 99.1 F (37.3 C) (Oral)  Resp 14  SpO2 98% General:  Alert, oriented and in no distress. ENT:  Her right lower second molar has a cavity at the gum line on the buccal surface.  No swelling of gingiva or of the floor of the mouth. Lungs:  Breath sounds clear and equal bilaterally.  No wheezes, rales or rhonchi. Heart:  Regular rhythm.  No gallops or murmers. Abdomen:  Soft, flat and non-distended.  No organomegaly or mass.  No tenderness, guarding or rebound.  Bowel sounds normally active. Pelvic exam:  There is a clumpy white non-malodorous discharge.  Has mild pain on cervical motion, with uterine palpation and over adenexa bilaterally, no mass.  DNA probes for gonorrhea, Chlamydia, Trichomonas, Gardnerella, Candida were obtained. Skin:  Clear, warm and dry.  Chaperoned by Deneise Leveresiree Brown, CMA who was  present throughout the pelvic exam.   Labs   Results for orders placed or performed during the hospital encounter of 12/14/13  Glucose, capillary  Result Value Ref Range   Glucose-Capillary 336 (H) 70 - 99 mg/dL  POCT urinalysis dip (device)  Result Value Ref Range   Glucose, UA >=1000 (A) NEGATIVE mg/dL   Bilirubin Urine NEGATIVE NEGATIVE   Ketones, ur NEGATIVE NEGATIVE mg/dL   Specific Gravity, Urine 1.015 1.005 - 1.030   Hgb urine dipstick NEGATIVE NEGATIVE   pH 7.0 5.0 - 8.0   Protein, ur NEGATIVE NEGATIVE mg/dL   Urobilinogen, UA 0.2 0.0 - 1.0 mg/dL   Nitrite NEGATIVE NEGATIVE   Leukocytes, UA NEGATIVE NEGATIVE  Pregnancy, urine POC  Result Value Ref Range   Preg Test, Ur NEGATIVE NEGATIVE    Assessment   The primary encounter diagnosis was Pulpitis. A diagnosis of Vaginitis was also pertinent to this visit.       Plan    1.  Meds:  The following meds were prescribed:   Discharge Medication List as of 12/14/2013  4:54 PM    START taking these medications   Details  amoxicillin (AMOXIL) 500 MG capsule Take 1 capsule (500 mg total) by mouth 3 (three) times daily., Starting 12/14/2013, Until Discontinued, Normal    fluconazole (DIFLUCAN) 150 MG tablet Take 1 tablet (150 mg total) by mouth once., Starting 12/14/2013,  Normal    HYDROcodone-acetaminophen (NORCO/VICODIN) 5-325 MG per tablet 1 to 2 tabs every 4 to 6 hours as needed for pain., Print    !! metFORMIN (GLUCOPHAGE) 500 MG tablet Take 1 tablet (500 mg total) by mouth 2 (two) times daily with a meal., Starting 12/14/2013, Until Discontinued, Normal    metroNIDAZOLE (FLAGYL) 500 MG tablet Take 1 tablet (500 mg total) by mouth 2 (two) times daily., Starting 12/14/2013, Until Discontinued, Normal     !! - Potential duplicate medications found. Please discuss with provider.      2.  Patient Education/Counseling:  The patient was given appropriate handouts, self care instructions, and instructed in symptomatic relief.     3.  Follow up:  The patient was told to follow up here if no better in 3 to 4 days, or sooner if becoming worse in any way, and given some red flag symptoms such as worsening pain, fever, persistent vomiting, or heavy vaginal bleeding which would prompt immediate return.      Reuben Likesavid C Abdelrahman Nair, MD 12/14/13 [phone]

## 2013-12-14 NOTE — Discharge Instructions (Signed)
Look up the [facility]'s [facility] for free dental clinics. [email or web address]  Get there early and be prepared to wait. [employer] and [employer] have Armed forces operational officerdental hygienist schools that provide low cost routine dental care.   Other resources: [facility] [address] [place], [place] [phone]  Patients with Medicaid: [facility]                     [facility] [phone]                                [phone] Phone:  [phone]                                                  Phone:  [phone]  Dr. Renee RivalJanice Civils [address] [phone]  If unable to pay or uninsured, contact:  [facility] or [facility] to become qualified for the adult dental clinic.  No matter what dental problem you have, it will not get better unless you get good dental care.  If the tooth is not taken care of, your symptoms will come back in time and you will be visiting us again in the Urgent Care Center with a bad toothache.  So, see your dentist as soon as possible.  If you don't have a dentist, we can give you a list of dentists.  Sometimes the most cost effective treatment is removal of the tooth.  This can be done very inexpensively through one of the low cost [employer] Centers such as the facility [address] in [place] [phone]).  The downside to this is that you will have one less tooth and this can effect your ability to chew.  Some other things that can be done for a dental infection include the following:   Rinse your mouth out with hot salt water (1/2 tsp of table salt and a pinch of baking soda in 8 oz of hot water).  You can do this every 2 or 3 hours.  Avoid cold foods, beverages, and cold air.  This will make your symptoms worse.  Sleep with your head elevated.  Sleeping flat will cause your gums and oral tissues to swell and make them hurt  more.  You can sleep on several pillows.  Even better is to sleep in a recliner with your head higher than your heart.  For mild to moderate pain, you can take Tylenol, ibuprofen, or Aleve.  External application of heat by a heating pad, hot water bottle, or hot wet towel can help with pain and speed healing.  You can do this every 2 to 3 hours. Do not fall asleep on a heating pad since this can cause a burn.    Candidal Vulvovaginitis Candidal vulvovaginitis is an infection of the vagina and vulva. The vulva is the skin around the opening of the vagina. This may cause itching and discomfort in and around the vagina.  HOME CARE  Only take medicine as told by your doctor.  Do not have sex (intercourse) until the infection is healed or as told by your doctor.  Practice safe sex.  Tell your sex partner about your infection.  Do not  douche or use tampons.  Wear cotton underwear. Do not wear tight pants or panty hose.  Eat yogurt. This may help treat and prevent yeast infections. GET HELP RIGHT AWAY IF:   You have a fever.  Your problems get worse during treatment or do not get better in 3 days.  You have discomfort, irritation, or itching in your vagina or vulva area.  You have pain after sex.  You start to get belly (abdominal) pain. MAKE SURE YOU:  Understand these instructions.  Will watch your condition.  Will get help right away if you are not doing well or get worse. Document Released: 04/24/2008 Document Revised: 01/31/2013 Document Reviewed: 04/24/2008 [facility] Patient Information 2015 [place]ExitCare, MarylandLLC. This information is not intended to replace advice given to you by your health care provider. Make sure you discuss any questions you have with your health care provider.

## 2013-12-15 LAB — CERVICOVAGINAL ANCILLARY ONLY
Chlamydia: NEGATIVE
Neisseria Gonorrhea: NEGATIVE

## 2013-12-18 LAB — CERVICOVAGINAL ANCILLARY ONLY
WET PREP (BD AFFIRM): NEGATIVE
WET PREP (BD AFFIRM): POSITIVE — AB
Wet Prep (BD Affirm): POSITIVE — AB

## 2013-12-23 NOTE — ED Notes (Signed)
GC/Chlamydia neg., Affirm: Candida and Gardnerella pos., Trich neg.  Pt. adequately treated with Diflucan and Flagyl. Vassie MoselleYork, Zak Gondek M 12/23/2013

## 2014-04-17 ENCOUNTER — Emergency Department (INDEPENDENT_AMBULATORY_CARE_PROVIDER_SITE_OTHER): Payer: BLUE CROSS/BLUE SHIELD

## 2014-04-17 ENCOUNTER — Encounter (HOSPITAL_COMMUNITY): Payer: Self-pay | Admitting: *Deleted

## 2014-04-17 ENCOUNTER — Emergency Department (INDEPENDENT_AMBULATORY_CARE_PROVIDER_SITE_OTHER)
Admission: EM | Admit: 2014-04-17 | Discharge: 2014-04-17 | Disposition: A | Discharge status: Home / Self Care | Payer: BLUE CROSS/BLUE SHIELD | Source: Home / Self Care | Attending: Family Medicine | Admitting: Family Medicine

## 2014-04-17 DIAGNOSIS — M65311 Trigger thumb, right thumb: Secondary | ICD-10-CM

## 2014-04-17 MED ORDER — METHYLPREDNISOLONE ACETATE PF 40 MG/ML IJ SUSP
20.0000 mg | Freq: Once | INTRAMUSCULAR | Status: AC
  Administered 2014-04-17: 20 mg via INTRA_ARTICULAR

## 2014-04-17 MED ORDER — METHYLPREDNISOLONE ACETATE 40 MG/ML IJ SUSP
INTRAMUSCULAR | Status: AC
  Filled 2014-04-17: qty 1

## 2014-04-17 MED ORDER — LIDOCAINE HCL (PF) 1 % IJ SOLN
INTRAMUSCULAR | Status: AC
  Filled 2014-04-17: qty 5

## 2014-04-17 NOTE — Discharge Instructions (Signed)
Thank you for coming in today. Follow up with hand surgery if not better.  Use the splint for a few days.  Call or go to the ER if you develop a large red swollen joint with extreme pain or oozing puss.    Trigger Finger Trigger finger (digital tendinitis and stenosing tenosynovitis) is a common disorder that causes an often painful catching of the fingers or thumb. It occurs as a clicking, snapping, or locking of a finger in the palm of the hand. This is caused by a problem with the tendons that flex or bend the fingers sliding smoothly through their sheaths. The condition may occur in any finger or a couple fingers at the same time.  The finger may lock with the finger curled or suddenly straighten out with a snap. This is more common in patients with rheumatoid arthritis and diabetes. Left untreated, the condition may get worse to the point where the finger becomes locked in flexion, like making a fist, or less commonly locked with the finger straightened out. CAUSES   Inflammation and scarring that lead to swelling around the tendon sheath.  Repeated or forceful movements.  Rheumatoid arthritis, an autoimmune disease that affects joints.  Gout.  Diabetes mellitus. SIGNS AND SYMPTOMS  Soreness and swelling of your finger.  A painful clicking or snapping as you bend and straighten your finger. DIAGNOSIS  Your health care provider will do a physical exam of your finger to diagnose trigger finger. TREATMENT   Splinting for 6-8 weeks may be helpful.  Nonsteroidal anti-inflammatory medicines (NSAIDs) can help to relieve the pain and inflammation.  Cortisone injections, along with splinting, may speed up recovery. Several injections may be required. Cortisone may give relief after one injection.  Surgery is another treatment that may be used if conservative treatments do not work. Surgery can be minor, without incisions (a cut does not have to be made), and can be done with a needle  through the skin.  Other surgical choices involve an open procedure in which the surgeon opens the hand through a small incision and cuts the pulley so the tendon can again slide smoothly. Your hand will still work fine. HOME CARE INSTRUCTIONS  Apply ice to the injured area, twice per day:  Put ice in a plastic bag.  Place a towel between your skin and the bag.  Leave the ice on for 20 minutes, 3-4 times a day.  Rest your hand often. MAKE SURE YOU:   Understand these instructions.  Will watch your condition.  Will get help right away if you are not doing well or get worse. Document Released: 11/16/2003 Document Revised: 09/28/2012 Document Reviewed: 06/28/2012 [facility] Patient Information 2015 [place], [place]. This information is not intended to replace advice given to you by your health care provider. Make sure you discuss any questions you have with your health care provider.

## 2014-04-17 NOTE — ED Provider Notes (Signed)
Sabrina ShiversSherry A Brooks is a 54 y.o. female who presents to Urgent Care today for right thumb pain. Patient has pain in her right thumb MCP present for about 2-1/2 weeks. The pain is worse with flexion of her interphalangeal joint. Pain seemed to happen after patient had her interphalangeal joint flexed for extended period of time or getting acrylic nails placed. She notes pain and swelling. She denies any radiating pain weakness or numbness fevers or chills. She has not tried any medications yet.   Past Medical History  Diagnosis Date  . Asthma   . Hypertension   . Diabetes mellitus   . Chest pain   . Obesity   . Palpitation    Past Surgical History  Procedure Laterality Date  . Ectopic pregnancy surgery  1990  . Ectopic pregnancy surgery     History  Substance Use Topics  . Smoking status: Never Smoker   . Smokeless tobacco: Never Used  . Alcohol Use: Yes     Comment: social drinker   ROS as above Medications: No current facility-administered medications for this encounter.   Current Outpatient Prescriptions  Medication Sig Dispense Refill  . acetaminophen (TYLENOL) 325 MG tablet Take 325 mg by mouth every 6 (six) hours as needed.    Marland Kitchen. amoxicillin (AMOXIL) 500 MG capsule Take 1 capsule (500 mg total) by mouth 3 (three) times daily. 30 capsule 0  . clindamycin (CLEOCIN) 300 MG capsule Take 1 capsule (300 mg total) by mouth 3 (three) times daily. 42 capsule 0  . fluconazole (DIFLUCAN) 150 MG tablet Take 1 tablet (150 mg total) by mouth once. 1 tablet 0  . HYDROcodone-acetaminophen (NORCO/VICODIN) 5-325 MG per tablet 1 to 2 tabs every 4 to 6 hours as needed for pain. 20 tablet 0  . ibuprofen (ADVIL,MOTRIN) 800 MG tablet Take 1 tablet (800 mg total) by mouth 3 (three) times daily. 21 tablet 0  . metFORMIN (GLUCOPHAGE) 500 MG tablet Take 1 tablet (500 mg total) by mouth 2 (two) times daily with a meal. 120 tablet 0  . metFORMIN (GLUCOPHAGE) 500 MG tablet Take 1 tablet (500 mg total) by  mouth 2 (two) times daily with a meal. 60 tablet 5  . metroNIDAZOLE (FLAGYL) 500 MG tablet Take 1 tablet (500 mg total) by mouth 2 (two) times daily. 14 tablet 0  . ondansetron (ZOFRAN) 4 MG tablet Take 1 tablet (4 mg total) by mouth every 6 (six) hours. 12 tablet 0   No Known Allergies   Exam:  BP 130/78 mmHg  Pulse 78  Temp(Src) 98.1 F (36.7 C) (Oral)  Resp 16  SpO2 98%  Gen: Well NAD Right arm: Elbow nontender normal motion Wrist nontender normal motion CMC of the thumb nontender normal motion MCP tender to palpation significantly decreased motion due to pain DIP nontender but decreased motion due to pain in the MCP Pulses capillary refill sensation are intact.  Limited musculoskeletal ultrasound: Flexor tendon identified at the volar A1 pulley. Bulge of the tendon seen just proximal to the pulley. Patient experienced pain with motion. Tendon intact Impression: Trigger finger  Trigger Thumb Injection: Consent obtained and timeout performed.  Skin cleaned with alcohol and cold spray applied. A 30-gauge needle was used to access the volar tendon sheath. 20 mg of Depo-Medrol and 0.5 mL of lidocaine were injected into the tendon sheath. Patient tolerated the procedure well. A Band-Aid splint was applied.   No results found for this or any previous visit (from the past 24 hour(s)). Dg  Finger Thumb Right  04/17/2014   CLINICAL DATA:  Thumb injury. Thumb pain at base of thumb. Swelling.  EXAM: RIGHT THUMB 2+V  COMPARISON:  Wrist series 2 09/2006  FINDINGS: No acute bony abnormality. Specifically, no fracture, subluxation, or dislocation. Soft tissues are intact.  IMPRESSION: No acute bony abnormality.   Electronically Signed   By: Charlett Nose M.D.   On: 04/17/2014 15:25    Assessment and Plan: 54 y.o. female with trigger thumb. I believe this to be the most likely explanation patient was given an injection as above as well as a thumb spica splint. She will follow-up with hand  surgery if not better. Warned about hyperglycemia following injection.  Discussed warning signs or symptoms. Please see discharge instructions. Patient expresses understanding.     Rodolph Bong, MD 04/17/14 [phone]

## 2014-04-17 NOTE — ED Notes (Signed)
Vitals entered dine @ 14:55

## 2014-04-17 NOTE — ED Notes (Signed)
Pt  Reports    Pain   r  Thumb        X  sev  Weeks    She  States  She  Had  Am  Acrylic nail      Put  On     She  Has  Pain on  Movement  And  Some  Swelling

## 2014-12-15 ENCOUNTER — Emergency Department (HOSPITAL_COMMUNITY)
Admission: EM | Admit: 2014-12-15 | Discharge: 2014-12-15 | Disposition: A | Discharge status: Home / Self Care | Payer: BLUE CROSS/BLUE SHIELD | Attending: Emergency Medicine | Admitting: Emergency Medicine

## 2014-12-15 ENCOUNTER — Emergency Department (HOSPITAL_COMMUNITY): Payer: BLUE CROSS/BLUE SHIELD

## 2014-12-15 ENCOUNTER — Encounter (HOSPITAL_COMMUNITY): Payer: Self-pay | Admitting: Emergency Medicine

## 2014-12-15 DIAGNOSIS — E119 Type 2 diabetes mellitus without complications: Secondary | ICD-10-CM | POA: Insufficient documentation

## 2014-12-15 DIAGNOSIS — T23202A Burn of second degree of left hand, unspecified site, initial encounter: Secondary | ICD-10-CM | POA: Diagnosis not present

## 2014-12-15 DIAGNOSIS — Y998 Other external cause status: Secondary | ICD-10-CM | POA: Diagnosis not present

## 2014-12-15 DIAGNOSIS — Z79899 Other long term (current) drug therapy: Secondary | ICD-10-CM | POA: Insufficient documentation

## 2014-12-15 DIAGNOSIS — I10 Essential (primary) hypertension: Secondary | ICD-10-CM | POA: Insufficient documentation

## 2014-12-15 DIAGNOSIS — S43401A Unspecified sprain of right shoulder joint, initial encounter: Secondary | ICD-10-CM | POA: Diagnosis not present

## 2014-12-15 DIAGNOSIS — E669 Obesity, unspecified: Secondary | ICD-10-CM | POA: Diagnosis not present

## 2014-12-15 DIAGNOSIS — J45909 Unspecified asthma, uncomplicated: Secondary | ICD-10-CM | POA: Insufficient documentation

## 2014-12-15 DIAGNOSIS — Y92410 Unspecified street and highway as the place of occurrence of the external cause: Secondary | ICD-10-CM | POA: Insufficient documentation

## 2014-12-15 DIAGNOSIS — S29001A Unspecified injury of muscle and tendon of front wall of thorax, initial encounter: Secondary | ICD-10-CM | POA: Diagnosis not present

## 2014-12-15 DIAGNOSIS — S4991XA Unspecified injury of right shoulder and upper arm, initial encounter: Secondary | ICD-10-CM | POA: Diagnosis present

## 2014-12-15 DIAGNOSIS — Y9389 Activity, other specified: Secondary | ICD-10-CM | POA: Diagnosis not present

## 2014-12-15 DIAGNOSIS — T300 Burn of unspecified body region, unspecified degree: Secondary | ICD-10-CM

## 2014-12-15 MED ORDER — HYDROCODONE-ACETAMINOPHEN 5-325 MG PO TABS
1.0000 | ORAL_TABLET | Freq: Once | ORAL | Status: AC
  Administered 2014-12-15: 1 via ORAL
  Filled 2014-12-15: qty 1

## 2014-12-15 MED ORDER — CYCLOBENZAPRINE HCL 10 MG PO TABS
10.0000 mg | ORAL_TABLET | Freq: Once | ORAL | Status: AC
  Administered 2014-12-15: 10 mg via ORAL
  Filled 2014-12-15: qty 1

## 2014-12-15 MED ORDER — CYCLOBENZAPRINE HCL 10 MG PO TABS: 10.0000 mg | ORAL_TABLET | Freq: Two times a day (BID) | ORAL | Status: DC | PRN

## 2014-12-15 MED ORDER — HYDROCODONE-ACETAMINOPHEN 5-325 MG PO TABS: 1.0000 | ORAL_TABLET | Freq: Four times a day (QID) | ORAL | Status: DC | PRN
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
## 2014-12-15 NOTE — Discharge Instructions (Signed)
Burn Care °Your skin is a natural barrier to infection. It is the largest organ of your body. Burns damage this natural protection. To help prevent infection, it is very important to follow your caregiver's instructions in the care of your burn. °Burns are classified as: °· First degree. There is only redness of the skin (erythema). No scarring is expected. °· Second degree. There is blistering of the skin. Scarring may occur with deeper burns. °· Third degree. All layers of the skin are injured, and scarring is expected. °HOME CARE INSTRUCTIONS  °· Wash your hands well before changing your bandage. °· Change your bandage as often as directed by your caregiver. °¨ Remove the old bandage. If the bandage sticks, you may soak it off with cool, clean water. °¨ Cleanse the burn thoroughly but gently with mild soap and water. °¨ Pat the area dry with a clean, dry cloth. °¨ Apply a thin layer of antibacterial cream to the burn. °¨ Apply a clean bandage as instructed by your caregiver. °¨ Keep the bandage as clean and dry as possible. °· Elevate the affected area for the first 24 hours, then as instructed by your caregiver. °· Only take over-the-counter or prescription medicines for pain, discomfort, or fever as directed by your caregiver. °SEEK IMMEDIATE MEDICAL CARE IF:  °· You develop excessive pain. °· You develop redness, tenderness, swelling, or red streaks near the burn. °· The burned area develops yellowish-white fluid (pus) or a bad smell. °· You have a fever. °MAKE SURE YOU:  °· Understand these instructions. °· Will watch your condition. °· Will get help right away if you are not doing well or get worse. °  °This information is not intended to replace advice given to you by your health care provider. Make sure you discuss any questions you have with your health care provider. °  °Document Released: 01/26/2005 Document Revised: 04/20/2011 Document Reviewed: 06/18/2010 °Elsevier Interactive Patient Education ©2016  [employer]vier Inc. ° °
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
## 2016-07-15 ENCOUNTER — Ambulatory Visit (HOSPITAL_COMMUNITY)
Admission: EM | Admit: 2016-07-15 | Discharge: 2016-07-15 | Disposition: A | Discharge status: Home / Self Care | Payer: BLUE CROSS/BLUE SHIELD | Attending: Internal Medicine | Admitting: Internal Medicine

## 2016-07-15 ENCOUNTER — Encounter (HOSPITAL_COMMUNITY): Payer: Self-pay | Admitting: Emergency Medicine

## 2016-07-15 DIAGNOSIS — M5432 Sciatica, left side: Secondary | ICD-10-CM | POA: Diagnosis not present

## 2016-07-15 MED ORDER — CYCLOBENZAPRINE HCL 10 MG PO TABS: 10.0000 mg | ORAL_TABLET | Freq: Two times a day (BID) | ORAL | 0 refills | Status: DC | PRN

## 2016-07-15 MED ORDER — PREDNISONE 10 MG (21) PO TBPK: ORAL_TABLET | ORAL | 0 refills | Status: DC

## 2016-07-15 NOTE — ED Provider Notes (Signed)
CSN: [id number]     Arrival date & time 07/15/16  1559 History   First MD Initiated Contact with Patient 07/15/16 1645     Chief Complaint  Patient presents with  . Leg Pain   (Consider location/radiation/quality/duration/timing/severity/associated sxs/prior Treatment) Patient c/o back pain radiating down left leg   The history is provided by the patient.  Leg Pain  Location:  Hip Injury: no   Hip location:  L hip Pain details:    Quality:  Aching   Radiates to:  Back   Severity:  Moderate   Onset quality:  Sudden   Duration:  3 days Chronicity:  New   Past Medical History:  Diagnosis Date  . Asthma   . Chest pain   . Diabetes mellitus   . Hypertension   . Obesity   . Palpitation    Past Surgical History:  Procedure Laterality Date  . ECTOPIC PREGNANCY SURGERY  1990  . ECTOPIC PREGNANCY SURGERY     No family history on file. Social History  Substance Use Topics  . Smoking status: Never Smoker  . Smokeless tobacco: Never Used  . Alcohol use Yes     Comment: social drinker   OB History    No data available     Review of Systems  Constitutional: Negative.   HENT: Negative.   Eyes: Negative.   Respiratory: Negative.  Negative for apnea.   Cardiovascular: Negative.   Endocrine: Negative.   Genitourinary: Negative.   Musculoskeletal: Positive for arthralgias.  Neurological: Negative.   Hematological: Negative.     Allergies  Patient has no known allergies.  Home Medications   Prior to Admission medications   Medication Sig Start Date End Date Taking? Authorizing Provider  acetaminophen (TYLENOL) 325 MG tablet Take 325 mg by mouth every 6 (six) hours as needed.    [provider]  amoxicillin (AMOXIL) 500 MG capsule Take 1 capsule (500 mg total) by mouth 3 (three) times daily. Patient not taking: Reported on 12/15/2014 12/14/13   Reuben Likes, MD  clindamycin (CLEOCIN) 300 MG capsule Take 1 capsule (300 mg total) by mouth 3 (three) times  daily. Patient not taking: Reported on 12/15/2014 05/15/13   Graylon Good, PA-C  cyclobenzaprine (FLEXERIL) 10 MG tablet Take 1 tablet (10 mg total) by mouth 2 (two) times daily as needed for muscle spasms. 12/15/14   Gwyneth Sprout, MD  cyclobenzaprine (FLEXERIL) 10 MG tablet Take 1 tablet (10 mg total) by mouth 2 (two) times daily as needed for muscle spasms. 07/15/16   Deatra Canter, FNP  fluconazole (DIFLUCAN) 150 MG tablet Take 1 tablet (150 mg total) by mouth once. Patient not taking: Reported on 12/15/2014 12/14/13   Reuben Likes, MD  HYDROcodone-acetaminophen (NORCO/VICODIN) 5-325 MG tablet Take 1-2 tablets by mouth every 6 (six) hours as needed for severe pain. 12/15/14   Gwyneth Sprout, MD  ibuprofen (ADVIL,MOTRIN) 800 MG tablet Take 1 tablet (800 mg total) by mouth 3 (three) times daily. Patient not taking: Reported on 12/15/2014 05/31/13   Paulina Fusi, Nada Boozer, PA-C  metFORMIN (GLUCOPHAGE) 500 MG tablet Take 1 tablet (500 mg total) by mouth 2 (two) times daily with a meal. 04/28/13 04/28/14  Toney Sang, MD  metFORMIN (GLUCOPHAGE) 500 MG tablet Take 1 tablet (500 mg total) by mouth 2 (two) times daily with a meal. Patient not taking: Reported on 12/15/2014 12/14/13   Reuben Likes, MD  metroNIDAZOLE (FLAGYL) 500 MG tablet Take 1 tablet (500 mg total)  by mouth 2 (two) times daily. Patient not taking: Reported on 12/15/2014 12/14/13   Reuben LikesKeller, David C, MD  ondansetron (ZOFRAN) 4 MG tablet Take 1 tablet (4 mg total) by mouth every 6 (six) hours. Patient not taking: Reported on 12/15/2014 05/31/13   Paulina FusiHess, Nada Boozerobyn M, PA-C  predniSONE (STERAPRED UNI-PAK 21 TAB) 10 MG (21) TBPK tablet Take 6-5-4-3-2-1 po qd 07/15/16   Deatra Canterxford, William J, FNP   Meds Ordered and Administered this Visit  Medications - No data to display  BP 134/80 (BP Location: Left Arm)   Pulse 84   Temp 98.9 F (37.2 C) (Oral)   Resp 20   SpO2 99%  No data found.   Physical Exam  Constitutional: She appears well-nourished.   HENT:  Head: Normocephalic and atraumatic.  Eyes: Conjunctivae and EOM are normal. Pupils are equal, round, and reactive to light.  Neck: Normal range of motion. Neck supple.  Cardiovascular: Normal rate, regular rhythm and normal heart sounds.   Pulmonary/Chest: Effort normal and breath sounds normal.  Musculoskeletal: She exhibits tenderness.  Nursing note and vitals reviewed.   Urgent Care Course     Procedures (including critical care time)  Labs Review Labs Reviewed - No data to display  Imaging Review No results found.   Visual Acuity Review  Right Eye Distance:   Left Eye Distance:   Bilateral Distance:    Right Eye Near:   Left Eye Near:    Bilateral Near:         MDM   1. Sciatica of left side    Prednisone taper Flexeril 10mg  po bid prn      Deatra CanterOxford, William J, FNP 07/15/16 1757

## 2016-07-15 NOTE — ED Triage Notes (Signed)
Patient has left leg pain from hip to and including left foot.  Patient has intermittent back pain.  No known injury.  Back pain 2 weeks ago, left leg started hurting 2 days ago and significantly worse today

## 2016-08-31 ENCOUNTER — Telehealth: Payer: Self-pay | Admitting: *Deleted

## 2016-08-31 NOTE — Telephone Encounter (Signed)
[facility] in [facility], her test was negative.  She is interested in PrEP. She is insured.  Andree CossHowell, Atlas Crossland M, RN

## 2016-09-01 ENCOUNTER — Telehealth: Payer: Self-pay | Admitting: Pharmacist Clinician (PhC)/ Clinical Pharmacy Specialist

## 2016-09-01 NOTE — Telephone Encounter (Signed)
Left a VM to call back for an appt for PrEP.

## 2016-09-15 NOTE — Telephone Encounter (Signed)
Can we get this pt in for PREP? thanks

## 2016-09-15 NOTE — Telephone Encounter (Signed)
Minh has tried to reach out to patient with no luck so far. We will keep trying.

## 2016-09-16 ENCOUNTER — Encounter (HOSPITAL_COMMUNITY): Payer: Self-pay | Admitting: Emergency Medicine

## 2016-09-16 DIAGNOSIS — R1032 Left lower quadrant pain: Secondary | ICD-10-CM | POA: Diagnosis present

## 2016-09-16 DIAGNOSIS — Z5321 Procedure and treatment not carried out due to patient leaving prior to being seen by health care provider: Secondary | ICD-10-CM | POA: Diagnosis not present

## 2016-09-16 LAB — URINALYSIS, ROUTINE W REFLEX MICROSCOPIC
BILIRUBIN URINE: NEGATIVE
Bacteria, UA: NONE SEEN
Hgb urine dipstick: NEGATIVE
Ketones, ur: 5 mg/dL — AB
LEUKOCYTES UA: NEGATIVE
NITRITE: NEGATIVE
PH: 6 (ref 5.0–8.0)
PROTEIN: NEGATIVE mg/dL
RBC / HPF: NONE SEEN RBC/hpf (ref 0–5)
Specific Gravity, Urine: 1.012 (ref 1.005–1.030)

## 2016-09-16 LAB — CBC
HCT: 39.3 % (ref 36.0–46.0)
Hemoglobin: 13.3 g/dL (ref 12.0–15.0)
MCH: 29.4 pg (ref 26.0–34.0)
MCHC: 33.8 g/dL (ref 30.0–36.0)
MCV: 86.9 fL (ref 78.0–100.0)
Platelets: 253 10*3/uL (ref 150–400)
RBC: 4.52 MIL/uL (ref 3.87–5.11)
RDW: 12.9 % (ref 11.5–15.5)
WBC: 7.4 10*3/uL (ref 4.0–10.5)

## 2016-09-16 LAB — BASIC METABOLIC PANEL
Anion gap: 11 (ref 5–15)
BUN: 13 mg/dL (ref 6–20)
CALCIUM: 9.7 mg/dL (ref 8.9–10.3)
CO2: 24 mmol/L (ref 22–32)
CREATININE: 0.65 mg/dL (ref 0.44–1.00)
Chloride: 99 mmol/L — ABNORMAL LOW (ref 101–111)
GFR calc Af Amer: >60 mL/min (ref >60)
GFR calc non Af Amer: >60 mL/min (ref >60)
GLUCOSE: 312 mg/dL — AB (ref 65–99)
Potassium: 3.6 mmol/L (ref 3.5–5.1)
Sodium: 134 mmol/L — ABNORMAL LOW (ref 135–145)

## 2016-09-16 NOTE — Telephone Encounter (Signed)
Thanks

## 2016-09-16 NOTE — ED Triage Notes (Signed)
Pt c/o 9/10 left side flank pain for about a week getting worse today, denies any urinary symptoms no fever or chills.

## 2016-09-17 ENCOUNTER — Emergency Department (HOSPITAL_COMMUNITY)
Admission: EM | Admit: 2016-09-17 | Discharge: 2016-09-17 | Disposition: A | Discharge status: Home / Self Care | Payer: BLUE CROSS/BLUE SHIELD | Attending: Emergency Medicine | Admitting: Emergency Medicine

## 2016-09-17 NOTE — ED Notes (Signed)
Called pt for vitals in waiting room with no response. 

## 2016-09-18 ENCOUNTER — Ambulatory Visit (HOSPITAL_COMMUNITY)
Admission: EM | Admit: 2016-09-18 | Discharge: 2016-09-18 | Disposition: A | Discharge status: Home / Self Care | Payer: BLUE CROSS/BLUE SHIELD | Attending: Internal Medicine | Admitting: Internal Medicine

## 2016-09-18 ENCOUNTER — Encounter (HOSPITAL_COMMUNITY): Payer: Self-pay | Admitting: Emergency Medicine

## 2016-09-18 DIAGNOSIS — M545 Low back pain, unspecified: Secondary | ICD-10-CM

## 2016-09-18 DIAGNOSIS — E119 Type 2 diabetes mellitus without complications: Secondary | ICD-10-CM

## 2016-09-18 DIAGNOSIS — E1165 Type 2 diabetes mellitus with hyperglycemia: Secondary | ICD-10-CM

## 2016-09-18 DIAGNOSIS — IMO0001 Reserved for inherently not codable concepts without codable children: Secondary | ICD-10-CM

## 2016-09-18 LAB — POCT URINALYSIS DIP (DEVICE)
Bilirubin Urine: NEGATIVE
Glucose, UA: 100 mg/dL — AB
Hgb urine dipstick: NEGATIVE
Ketones, ur: NEGATIVE mg/dL
Leukocytes, UA: NEGATIVE
NITRITE: NEGATIVE
PROTEIN: NEGATIVE mg/dL
SPECIFIC GRAVITY, URINE: 1.025 (ref 1.005–1.030)
Urobilinogen, UA: 0.2 mg/dL (ref 0.0–1.0)
pH: 6 (ref 5.0–8.0)

## 2016-09-18 MED ORDER — CYCLOBENZAPRINE HCL 10 MG PO TABS: 10.0000 mg | ORAL_TABLET | Freq: Every day | ORAL | 0 refills | Status: DC

## 2016-09-18 MED ORDER — NAPROXEN 500 MG PO TABS: 500.0000 mg | ORAL_TABLET | Freq: Two times a day (BID) | ORAL | 0 refills | Status: DC

## 2016-09-18 NOTE — ED Provider Notes (Addendum)
    MRN: [id number] DOB: Nov 15, 1960  Subjective:   Sabrina ShiversSherry A Brooks is a 56 y.o. female presenting for chief complaint of Back Pain  Reports 2 week history of left-sided low back pain. Pain started while she was at work, is sharp, intermittent and occurs daily, radiates across her low back. 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 Has not tried any medications for relief. Has tried an otc spray with minimal relief. States that she has had sciatica diagnosed before. Does not hydrate well at all. Drinks sodas, drinks beer regularly. Denies fever, vomiting, abdominal pain, dysuria, hematuria, urinary frequency, incontinence. Denies smoking cigarettes.   Sabrina Brooks is not currently taking any medications. Also has No Known Allergies.  Sabrina Brooks  has a past medical history of Asthma; Chest pain; Diabetes mellitus; Hypertension; Obesity; and Palpitation. Also  has a past surgical history that includes Ectopic pregnancy surgery (1990) and Ectopic pregnancy surgery.  Objective:   Vitals: BP 140/87 (BP Location: Left Arm)   Pulse 81   Temp 97.7 F (36.5 C) (Oral)   Resp 16   SpO2 98%   Physical Exam  Constitutional: She is oriented to person, place, and time. She appears well-developed and well-nourished.  Cardiovascular: Normal rate.   Pulmonary/Chest: Effort normal.  Musculoskeletal:       Lumbar back: She exhibits tenderness (on palpation over area depicted and with ROM testing including rotation to the left, lateral flexion, flexion). She exhibits normal range of motion, no bony tenderness, no swelling, no edema, no deformity and no spasm.       Back:  Negative SLR.  Neurological: She is alert and oriented to person, place, and time. She displays normal reflexes. Coordination normal.   Results for orders placed or performed during the hospital encounter of 09/18/16 (from the past 24 hour(s))  POCT urinalysis dip (device)     Status: Abnormal   Collection Time:  09/18/16  5:08 PM  Result Value Ref Range   Glucose, UA 100 (A) NEGATIVE mg/dL   Bilirubin Urine NEGATIVE NEGATIVE   Ketones, ur NEGATIVE NEGATIVE mg/dL   Specific Gravity, Urine 1.025 1.005 - 1.030   Hgb urine dipstick NEGATIVE NEGATIVE   pH 6.0 5.0 - 8.0   Protein, ur NEGATIVE NEGATIVE mg/dL   Urobilinogen, UA 0.2 0.0 - 1.0 mg/dL   Nitrite NEGATIVE NEGATIVE   Leukocytes, UA NEGATIVE NEGATIVE   Assessment and Plan :   1. Acute left-sided low back pain without sciatica   2. Uncontrolled type 2 diabetes mellitus without complication, without long-term current use of insulin (HCC)    Will start patient on NSAID and muscle relaxant for her back pain. Labs from 09/16/2016 show normal renal function. She is to hydrate much better. Counseled patient on potential for adverse effects with medications prescribed today, patient verbalized understanding. Return-to-clinic precautions discussed, patient verbalized understanding. Recommended patient seek PCP for management of her diabetes.  Wallis BambergMani, Shacara Cozine, PA-C 09/18/16 1842    Wallis BambergMani, Sayed Apostol, PA-C 09/18/16 1843

## 2016-09-18 NOTE — ED Triage Notes (Signed)
[facility] a complaint of left lower back pain x 2 weeks. The patient also reported some dysuria.

## 2016-09-24 ENCOUNTER — Telehealth: Payer: Self-pay | Admitting: Pharmacist Clinician (PhC)/ Clinical Pharmacy Specialist

## 2016-09-24 NOTE — Telephone Encounter (Signed)
Tried to reach out again to see if we can bring back for PrEP but couldn't get in touch. Left VM

## 2016-12-15 ENCOUNTER — Telehealth: Payer: Self-pay | Admitting: Pharmacist Clinician (PhC)/ Clinical Pharmacy Specialist

## 2016-12-15 NOTE — Telephone Encounter (Signed)
Can't get in touch to set up PrEP appt.

## 2017-02-10 ENCOUNTER — Telehealth: Payer: Self-pay | Admitting: Pharmacist

## 2017-02-10 ENCOUNTER — Other Ambulatory Visit: Payer: Self-pay | Admitting: Pharmacist

## 2017-02-10 NOTE — Telephone Encounter (Signed)
Sabrina Brooks was here today with her partner who sees Dr. Ninetta LightsHatcher.  She had a rapid HIV test done today that was negative.  She is interested in PrEP.  Spoke to her and scheduled her to come in 1/15 at 3pm to discuss.

## 2017-02-23 ENCOUNTER — Ambulatory Visit: Payer: BLUE CROSS/BLUE SHIELD

## 2017-04-10 ENCOUNTER — Encounter (HOSPITAL_COMMUNITY): Payer: Self-pay | Admitting: Emergency Medicine

## 2017-04-10 ENCOUNTER — Emergency Department (HOSPITAL_COMMUNITY)
Admission: EM | Admit: 2017-04-10 | Discharge: 2017-04-10 | Disposition: A | Discharge status: Home / Self Care | Payer: BLUE CROSS/BLUE SHIELD | Attending: Emergency Medicine | Admitting: Emergency Medicine

## 2017-04-10 ENCOUNTER — Emergency Department (HOSPITAL_COMMUNITY): Payer: BLUE CROSS/BLUE SHIELD

## 2017-04-10 DIAGNOSIS — E119 Type 2 diabetes mellitus without complications: Secondary | ICD-10-CM | POA: Insufficient documentation

## 2017-04-10 DIAGNOSIS — Y939 Activity, unspecified: Secondary | ICD-10-CM | POA: Diagnosis not present

## 2017-04-10 DIAGNOSIS — S0083XA Contusion of other part of head, initial encounter: Secondary | ICD-10-CM | POA: Diagnosis not present

## 2017-04-10 DIAGNOSIS — J45909 Unspecified asthma, uncomplicated: Secondary | ICD-10-CM | POA: Insufficient documentation

## 2017-04-10 DIAGNOSIS — Y999 Unspecified external cause status: Secondary | ICD-10-CM | POA: Insufficient documentation

## 2017-04-10 DIAGNOSIS — S60512A Abrasion of left hand, initial encounter: Secondary | ICD-10-CM | POA: Insufficient documentation

## 2017-04-10 DIAGNOSIS — I10 Essential (primary) hypertension: Secondary | ICD-10-CM | POA: Insufficient documentation

## 2017-04-10 DIAGNOSIS — Y929 Unspecified place or not applicable: Secondary | ICD-10-CM | POA: Insufficient documentation

## 2017-04-10 DIAGNOSIS — Z7984 Long term (current) use of oral hypoglycemic drugs: Secondary | ICD-10-CM | POA: Insufficient documentation

## 2017-04-10 DIAGNOSIS — S0993XA Unspecified injury of face, initial encounter: Secondary | ICD-10-CM | POA: Diagnosis present

## 2017-04-10 DIAGNOSIS — T07XXXA Unspecified multiple injuries, initial encounter: Secondary | ICD-10-CM

## 2017-04-10 DIAGNOSIS — Z79899 Other long term (current) drug therapy: Secondary | ICD-10-CM | POA: Diagnosis not present

## 2017-04-10 MED ORDER — IBUPROFEN 400 MG PO TABS
600.0000 mg | ORAL_TABLET | Freq: Once | ORAL | Status: AC
  Administered 2017-04-10: 600 mg via ORAL
  Filled 2017-04-10: qty 1

## 2017-04-10 MED ORDER — HYDROCODONE-ACETAMINOPHEN 5-325 MG PO TABS
1.0000 | ORAL_TABLET | Freq: Once | ORAL | Status: AC
  Administered 2017-04-10: 1 via ORAL
  Filled 2017-04-10: qty 1
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
## 2018-07-28 ENCOUNTER — Encounter (HOSPITAL_COMMUNITY): Payer: Self-pay

## 2018-07-28 ENCOUNTER — Other Ambulatory Visit: Payer: Self-pay

## 2018-07-28 ENCOUNTER — Ambulatory Visit (HOSPITAL_COMMUNITY)
Admission: EM | Admit: 2018-07-28 | Discharge: 2018-07-28 | Disposition: A | Discharge status: Home / Self Care | Payer: PRIVATE HEALTH INSURANCE | Attending: Emergency Medicine | Admitting: Emergency Medicine

## 2018-07-28 DIAGNOSIS — B9689 Other specified bacterial agents as the cause of diseases classified elsewhere: Secondary | ICD-10-CM | POA: Diagnosis present

## 2018-07-28 DIAGNOSIS — E119 Type 2 diabetes mellitus without complications: Secondary | ICD-10-CM | POA: Diagnosis present

## 2018-07-28 DIAGNOSIS — B373 Candidiasis of vulva and vagina: Secondary | ICD-10-CM

## 2018-07-28 DIAGNOSIS — B3731 Acute candidiasis of vulva and vagina: Secondary | ICD-10-CM

## 2018-07-28 DIAGNOSIS — N760 Acute vaginitis: Secondary | ICD-10-CM | POA: Diagnosis present

## 2018-07-28 MED ORDER — METRONIDAZOLE 500 MG PO TABS: 500.0000 mg | ORAL_TABLET | Freq: Two times a day (BID) | ORAL | 0 refills | Status: AC

## 2018-07-28 MED ORDER — GLUCOSE BLOOD VI STRP: ORAL_STRIP | 2 refills | Status: AC

## 2018-07-28 MED ORDER — FREESTYLE SYSTEM KIT: 1.0000 | PACK | Freq: Every day | 0 refills | Status: AC

## 2018-07-28 MED ORDER — FLUCONAZOLE 150 MG PO TABS: 150.0000 mg | ORAL_TABLET | Freq: Once | ORAL | 1 refills | Status: AC

## 2018-07-28 NOTE — ED Triage Notes (Signed)
Pt presents with symptoms of vagintis; Pt states she is having discharge and vaginal itchiness.

## 2018-07-28 NOTE — ED Provider Notes (Signed)
HPI  SUBJECTIVE:  Sabrina Brooks is a 58 y.o. female who presents with 1 month of vaginal itching/burning.  She reports odorous discharge.  She states that she has been scratching herself extensively.  States that she felt some "bumps" along the right labia several days ago.  She is not sure if she has a rash or any blisters.  She tried applying plain yogurt topically without improvement in her symptoms.  No other aggravating or alleviating factors.  No fevers.  No abdominal, back, pelvic pain.  No vaginal bleeding, urinary complaints.  She states that this is identical to previous yeast infections.  No recent antibiotics, new body washes.  She is in a long-term monogamous relationship with a female who is asymptomatic, STDs are not a concern.  She has a past medical history of uncontrolled diabetes, does not monitor her sugars at home.  States that she checks it at work periodically, states that it has been running in the 300s for "a while".  Is not taking metformin due to side effects.  Also history of hypertension, vaginal yeast infections.  No history of kidney disease, gonorrhea, chlamydia, HIV, HSV, syphilis, trichomonas, BV.  PMD: None.    Past Medical History:  Diagnosis Date  . Asthma   . Chest pain   . Diabetes mellitus   . Hypertension   . Obesity   . Palpitation     Past Surgical History:  Procedure Laterality Date  . ECTOPIC PREGNANCY SURGERY  1990  . ECTOPIC PREGNANCY SURGERY      Family History  Family history unknown: Yes    Social History   Tobacco Use  . Smoking status: Never Smoker  . Smokeless tobacco: Never Used  Substance Use Topics  . Alcohol use: Yes    Comment: social drinker  . Drug use: No    Comment: occasional    No current facility-administered medications for this encounter.   Current Outpatient Medications:  .  fluconazole (DIFLUCAN) 150 MG tablet, Take 1 tablet (150 mg total) by mouth once for 1 dose. 1 tab po x 1. May repeat in 72 hours if  no improvement, Disp: 2 tablet, Rfl: 1 .  glucose blood test strip, Check your sugar in the morning before you eat breakfast, and one hour after a meal., Disp: 100 each, Rfl: 2 .  glucose monitoring kit (FREESTYLE) monitoring kit, 1 each by Does not apply route daily. Check glucose once in the morning before breakfast and 1 hour after a meal, Disp: 1 each, Rfl: 0 .  metFORMIN (GLUCOPHAGE) 500 MG tablet, Take 1 tablet (500 mg total) by mouth 2 (two) times daily with a meal., Disp: 120 tablet, Rfl: 0 .  metroNIDAZOLE (FLAGYL) 500 MG tablet, Take 1 tablet (500 mg total) by mouth 2 (two) times daily for 7 days., Disp: 14 tablet, Rfl: 0  No Known Allergies   ROS  As noted in HPI.   Physical Exam  BP (!) 131/94 (BP Location: Left Arm)   Pulse 91   Temp (!) 97.4 F (36.3 C) (Oral)   Resp 17   SpO2 93%   Constitutional: Well developed, well nourished, no acute distress Eyes:  EOMI, conjunctiva normal bilaterally HENT: Normocephalic, atraumatic,mucus membranes moist Respiratory: Normal inspiratory effort Cardiovascular: Normal rate GI: nondistended soft, nontender. No suprapubic tenderness  back: No CVA tenderness GU: External genitalia with excoriations.  No labial swelling.  No blisters.  Thin white odorous vaginal discharge externally.  Normal vaginal mucosa.  Normal os.  thick oderous yellowish vaginal discharge. Uterus smooth, NT. No CMT. No adnexal tenderness. No adnexal masses.  Chaperone present during exam skin: No rash, skin intact Musculoskeletal: no deformities Neurologic: Alert & oriented x 3, no focal neuro deficits Psychiatric: Speech and behavior appropriate   ED Course   Medications - No data to display  No orders of the defined types were placed in this encounter.   No results found for this or any previous visit (from the past 24 hour(s)). No results found.  ED Clinical Impression  Yeast vaginitis -   BV (bacterial vaginosis) -  Uncontrolled DM 2  ED  Assessment/Plan  H&P most c/w yeast infection, however concern for BV as well.  Feel that she is low risk for gonorrhea, chlamydia, trichomonas, so deferring treatment until labs are resulted.  Sent off GC/chlamydia per patient request, wet prep. Will send home with flagyl for possible BV and diflucan for yeast infection. Advised pt to refrain from sexual contact until she knows lab results, symptoms resolve, and partner(s) are treated if necessary. Pt provided working phone number. Follow-up with PMD of choice as needed- providing primary care referral list.  We will also prescribe a glucometer and lancets so she can monitor her blood sugar at home and keep a log of this.  We discussed her restarting metformin to get her diabetes under control but patient states that she tolerates it poorly.  Discussed labs, MDM, plan and followup with patient. Pt agrees with plan.   Meds ordered this encounter  Medications  . metroNIDAZOLE (FLAGYL) 500 MG tablet    Sig: Take 1 tablet (500 mg total) by mouth 2 (two) times daily for 7 days.    Dispense:  14 tablet    Refill:  0  . fluconazole (DIFLUCAN) 150 MG tablet    Sig: Take 1 tablet (150 mg total) by mouth once for 1 dose. 1 tab po x 1. May repeat in 72 hours if no improvement    Dispense:  2 tablet    Refill:  1  . glucose blood test strip    Sig: Check your sugar in the morning before you eat breakfast, and one hour after a meal.    Dispense:  100 each    Refill:  2  . glucose monitoring kit (FREESTYLE) monitoring kit    Sig: 1 each by Does not apply route daily. Check glucose once in the morning before breakfast and 1 hour after a meal    Dispense:  1 each    Refill:  0    *This clinic note was created using [employer]. Therefore, there may be occasional mistakes despite careful proofreading.  ?    Melynda Ripple, MD 07/28/18 1643
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
## 2018-07-29 LAB — CERVICOVAGINAL ANCILLARY ONLY
Bacterial vaginitis: POSITIVE — AB
Candida vaginitis: POSITIVE — AB
Chlamydia: NEGATIVE
Neisseria Gonorrhea: NEGATIVE
Trichomonas: POSITIVE — AB

## 2018-08-01 ENCOUNTER — Telehealth (HOSPITAL_COMMUNITY): Payer: Self-pay | Admitting: Emergency Medicine

## 2018-08-01 NOTE — Telephone Encounter (Signed)
Bacterial Vaginosis test is positive.  Prescription for metronidazole was given at the urgent care visit. Pt contacted regarding results. Answered all questions. Verbalized understanding.  Trichomonas is positive. Rx metronidazole was given at the urgent care visit. Pt needs education to please refrain from sexual intercourse for 7 days to give the medicine time to work. Sexual partners need to be notified and tested/treated. Condoms may reduce risk of reinfection. Recheck for further evaluation if symptoms are not improving.   Candida (yeast) is positive.  Prescription for fluconazole was given at the urgent care visit.    Patient contacted and made aware of all results, all questions answered.

## 2019-01-02 ENCOUNTER — Emergency Department (HOSPITAL_COMMUNITY)
Admission: EM | Admit: 2019-01-02 | Discharge: 2019-01-02 | Disposition: A | Discharge status: Home / Self Care | Payer: PRIVATE HEALTH INSURANCE | Attending: Emergency Medicine | Admitting: Emergency Medicine

## 2019-01-02 ENCOUNTER — Emergency Department (HOSPITAL_COMMUNITY): Payer: PRIVATE HEALTH INSURANCE

## 2019-01-02 ENCOUNTER — Encounter (HOSPITAL_COMMUNITY): Payer: Self-pay | Admitting: Emergency Medicine

## 2019-01-02 ENCOUNTER — Other Ambulatory Visit: Payer: Self-pay

## 2019-01-02 DIAGNOSIS — M79644 Pain in right finger(s): Secondary | ICD-10-CM | POA: Insufficient documentation

## 2019-01-02 DIAGNOSIS — I10 Essential (primary) hypertension: Secondary | ICD-10-CM | POA: Insufficient documentation

## 2019-01-02 DIAGNOSIS — Z7984 Long term (current) use of oral hypoglycemic drugs: Secondary | ICD-10-CM | POA: Insufficient documentation

## 2019-01-02 DIAGNOSIS — Z79899 Other long term (current) drug therapy: Secondary | ICD-10-CM | POA: Diagnosis not present

## 2019-01-02 DIAGNOSIS — E119 Type 2 diabetes mellitus without complications: Secondary | ICD-10-CM | POA: Diagnosis not present

## 2019-01-02 MED ORDER — NAPROXEN 500 MG PO TABS: 500.0000 mg | ORAL_TABLET | Freq: Two times a day (BID) | ORAL | 0 refills | Status: DC

## 2019-01-02 MED ORDER — NAPROXEN 250 MG PO TABS
500.0000 mg | ORAL_TABLET | Freq: Once | ORAL | Status: AC
  Administered 2019-01-02: 16:00:00 500 mg via ORAL
  Filled 2019-01-02: qty 2

## 2019-01-02 NOTE — Discharge Instructions (Addendum)
Take Naproxen twice daily for pain with food Apply ice for 20 minutes at a time several times a day Please return to Urgent care or the ED if you are not improving

## 2019-01-02 NOTE — ED Provider Notes (Signed)
[facility] EMERGENCY DEPARTMENT Provider Note   CSN: [id number] Arrival date & time: 01/02/19  1302     History   Chief Complaint Chief Complaint  Patient presents with  . Finger Injury    HPI Sabrina Brooks is a 58 y.o. right-hand-dominant female with history of diabetes who presents with right index finger pain.  Patient states that she was at work today when she had an acute onset of right index finger pain.  The pain is over the dorsal aspect of the finger and is in between the MCP and PIP joint.  She is having difficulty moving her finger because the pain is too severe.  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  She denies any specific injury.  She denies any clicking or popping of the finger.  She recently had acrylic nails placed but states that she is not having any distal finger pain.  There is no redness or swelling of the finger.  She has been treated for trigger thumb in the past received an injection in her thumb but she cannot recall if the pain is similar to that or not.  She has not taken anything for pain     HPI  Past Medical History:  Diagnosis Date  . Asthma   . Chest pain   . Diabetes mellitus   . Hypertension   . Obesity   . Palpitation     Patient Active Problem List   Diagnosis Date Noted  . Palpitations 06/09/2011  . Chest pain 06/09/2011  . Diabetes mellitus 06/09/2011    Past Surgical History:  Procedure Laterality Date  . ECTOPIC PREGNANCY SURGERY  1990  . ECTOPIC PREGNANCY SURGERY       OB History   No obstetric history on file.      Home Medications    Prior to Admission medications   Medication Sig Start Date End Date Taking? Authorizing Provider  glucose blood test strip Check your sugar in the morning before you eat breakfast, and one hour after a meal. 07/28/18   Melynda Ripple, MD  glucose monitoring kit (FREESTYLE)  monitoring kit 1 each by Does not apply route daily. Check glucose once in the morning before breakfast and 1 hour after a meal 07/28/18   Melynda Ripple, MD  metFORMIN (GLUCOPHAGE) 500 MG tablet Take 1 tablet (500 mg total) by mouth 2 (two) times daily with a meal. 04/28/13 04/28/14  Wendall Papa, MD  naproxen (NAPROSYN) 500 MG tablet Take 1 tablet (500 mg total) by mouth 2 (two) times daily. 01/02/19   Recardo Evangelist, PA-C    Family History Family History  Family history unknown: Yes    Social History Social History   Tobacco Use  . Smoking status: Never Smoker  . Smokeless tobacco: Never Used  Substance Use Topics  . Alcohol use: Yes    Comment: social drinker  . Drug use: No     Allergies   Patient has no known allergies.   Review of Systems Review of Systems  Musculoskeletal: Positive for arthralgias.  Neurological: Negative for weakness and numbness.     Physical Exam Updated Vital Signs BP 128/86 (BP Location: Left Arm)   Pulse 73   Temp 97.7 F (36.5 C) (Oral)   Resp 18   Ht '5\' 6"'$  (1.676 m)   Wt 89.8 kg   SpO2 98%   BMI  31.96 kg/m   Physical Exam Vitals signs and nursing note reviewed.  Constitutional:      General: She is not in acute distress.    Appearance: Normal appearance. She is well-developed. She is not ill-appearing.  HENT:     Head: Normocephalic and atraumatic.  Eyes:     General: No scleral icterus.       Right eye: No discharge.        Left eye: No discharge.     Conjunctiva/sclera: Conjunctivae normal.     Pupils: Pupils are equal, round, and reactive to light.  Neck:     Musculoskeletal: Normal range of motion.  Cardiovascular:     Rate and Rhythm: Normal rate.  Pulmonary:     Effort: Pulmonary effort is normal. No respiratory distress.  Abdominal:     General: There is no distension.  Musculoskeletal:     Comments: Right index finger: No obvious swelling, redness, deformity, or warmth. Tenderness to palpation over the  dorsal aspect of the finger from the MCP joint to PIP joint. Able to slightly wiggle the finger - limited due to pain. Cap refill <2. N/V intact.   Skin:    General: Skin is warm and dry.  Neurological:     Mental Status: She is alert and oriented to person, place, and time.  Psychiatric:        Behavior: Behavior normal.      ED Treatments / Results  Labs (all labs ordered are listed, but only abnormal results are displayed) Labs Reviewed - No data to display  EKG None  Radiology Dg Finger Index Right  Result Date: 01/02/2019 CLINICAL DATA:  Pain EXAM: RIGHT SECOND FINGER 2+V COMPARISON:  None. FINDINGS: Frontal, oblique, and lateral views obtained. There is mild soft tissue swelling. No fracture or dislocation. Joint spaces appear normal. No erosive change. IMPRESSION: Mild soft tissue swelling. No fracture or dislocation. No evident arthropathy. Electronically Signed   By: Lowella Grip III M.D.   On: 01/02/2019 14:16    Procedures Procedures (including critical care time)  Medications Ordered in ED Medications  naproxen (NAPROSYN) tablet 500 mg (500 mg Oral Given 01/02/19 1606)     Initial Impression / Assessment and Plan / ED Course  I have reviewed the triage vital signs and the nursing notes.  Pertinent labs & imaging results that were available during my care of the patient were reviewed by me and considered in my medical decision making (see chart for details).  58 year old female presents with acute right finger pain while at work today.  Her vitals are normal.  There are no objective exam findings.  Range of motion of the finger is limited due to pain.  X-rays is remarkable for mild soft tissue swelling.  Chart review reveals she has had trigger thumb in the past although symptoms are not totally consistent with this.  She was offered a digital block but she declined stating she does not like needles.  She has not tried anything p.o. so we will give her a short  course of naproxen.  She is advised to return if she is not improving in the next several days.  Final Clinical Impressions(s) / ED Diagnoses   Final diagnoses:  Finger pain, right    ED Discharge Orders         Ordered    naproxen (NAPROSYN) 500 MG tablet  2 times daily     01/02/19 1601  Recardo Evangelist, PA-C 01/02/19 1837    Merrily Pew, MD 01/02/19 [phone]

## 2019-01-02 NOTE — ED Notes (Signed)
Esig pad not working. Pt received d/c instructions, understands them and is ready for d/c.

## 2019-01-02 NOTE — ED Triage Notes (Signed)
Onset today while at work developed right index finger pain denies trauma. States pain increases with palpation and movement has full sensation radial pulse +2.

## 2019-02-19 ENCOUNTER — Other Ambulatory Visit: Payer: Self-pay

## 2019-02-19 ENCOUNTER — Emergency Department (HOSPITAL_COMMUNITY)
Admission: EM | Admit: 2019-02-19 | Discharge: 2019-02-19 | Disposition: A | Discharge status: Home / Self Care | Payer: PRIVATE HEALTH INSURANCE | Attending: Emergency Medicine | Admitting: Emergency Medicine

## 2019-02-19 ENCOUNTER — Encounter (HOSPITAL_COMMUNITY): Payer: Self-pay | Admitting: Emergency Medicine

## 2019-02-19 ENCOUNTER — Emergency Department (HOSPITAL_COMMUNITY): Payer: PRIVATE HEALTH INSURANCE

## 2019-02-19 DIAGNOSIS — U071 COVID-19: Secondary | ICD-10-CM | POA: Diagnosis not present

## 2019-02-19 DIAGNOSIS — E119 Type 2 diabetes mellitus without complications: Secondary | ICD-10-CM | POA: Diagnosis not present

## 2019-02-19 DIAGNOSIS — J45909 Unspecified asthma, uncomplicated: Secondary | ICD-10-CM | POA: Insufficient documentation

## 2019-02-19 DIAGNOSIS — Z79899 Other long term (current) drug therapy: Secondary | ICD-10-CM | POA: Diagnosis not present

## 2019-02-19 DIAGNOSIS — R059 Cough, unspecified: Secondary | ICD-10-CM

## 2019-02-19 DIAGNOSIS — Z7984 Long term (current) use of oral hypoglycemic drugs: Secondary | ICD-10-CM | POA: Diagnosis not present

## 2019-02-19 DIAGNOSIS — R05 Cough: Secondary | ICD-10-CM | POA: Diagnosis not present

## 2019-02-19 DIAGNOSIS — I10 Essential (primary) hypertension: Secondary | ICD-10-CM | POA: Insufficient documentation

## 2019-02-19 DIAGNOSIS — R519 Headache, unspecified: Secondary | ICD-10-CM | POA: Insufficient documentation

## 2019-02-19 DIAGNOSIS — R197 Diarrhea, unspecified: Secondary | ICD-10-CM | POA: Diagnosis present

## 2019-02-19 LAB — URINALYSIS, ROUTINE W REFLEX MICROSCOPIC
Bacteria, UA: NONE SEEN
Bilirubin Urine: NEGATIVE
Glucose, UA: >=500 mg/dL — AB
Hgb urine dipstick: NEGATIVE
Ketones, ur: 5 mg/dL — AB
Leukocytes,Ua: NEGATIVE
Nitrite: NEGATIVE
Protein, ur: 30 mg/dL — AB
Specific Gravity, Urine: 1.015 (ref 1.005–1.030)
pH: 6 (ref 5.0–8.0)

## 2019-02-19 LAB — COMPREHENSIVE METABOLIC PANEL
ALT: 22 U/L (ref 0–44)
AST: 21 U/L (ref 15–41)
Albumin: 3.8 g/dL (ref 3.5–5.0)
Alkaline Phosphatase: 90 U/L (ref 38–126)
Anion gap: 9 (ref 5–15)
BUN: 12 mg/dL (ref 6–20)
CO2: 24 mmol/L (ref 22–32)
Calcium: 9.3 mg/dL (ref 8.9–10.3)
Chloride: 101 mmol/L (ref 98–111)
Creatinine, Ser: 0.77 mg/dL (ref 0.44–1.00)
GFR calc Af Amer: >60 mL/min (ref >60)
GFR calc non Af Amer: >60 mL/min (ref >60)
Glucose, Bld: 285 mg/dL — ABNORMAL HIGH (ref 70–99)
Potassium: 3.5 mmol/L (ref 3.5–5.1)
Sodium: 134 mmol/L — ABNORMAL LOW (ref 135–145)
Total Bilirubin: 1.1 mg/dL (ref 0.3–1.2)
Total Protein: 7.7 g/dL (ref 6.5–8.1)

## 2019-02-19 LAB — LIPASE, BLOOD: Lipase: 22 U/L (ref 11–51)

## 2019-02-19 LAB — CBC
HCT: 42.4 % (ref 36.0–46.0)
Hemoglobin: 14.1 g/dL (ref 12.0–15.0)
MCH: 30.1 pg (ref 26.0–34.0)
MCHC: 33.3 g/dL (ref 30.0–36.0)
MCV: 90.4 fL (ref 80.0–100.0)
Platelets: 216 10*3/uL (ref 150–400)
RBC: 4.69 MIL/uL (ref 3.87–5.11)
RDW: 12.5 % (ref 11.5–15.5)
WBC: 3.9 10*3/uL — ABNORMAL LOW (ref 4.0–10.5)
nRBC: 0 % (ref 0.0–0.2)

## 2019-02-19 LAB — POC SARS CORONAVIRUS 2 AG -  ED: SARS Coronavirus 2 Ag: POSITIVE — AB

## 2019-02-19 MED ORDER — BENZONATATE 100 MG PO CAPS: 100.0000 mg | ORAL_CAPSULE | Freq: Three times a day (TID) | ORAL | 0 refills | Status: DC

## 2019-02-19 MED ORDER — SODIUM CHLORIDE 0.9% FLUSH
3.0000 mL | Freq: Once | INTRAVENOUS | Status: AC
  Administered 2019-02-19: 13:00:00 3 mL via INTRAVENOUS

## 2019-02-19 MED ORDER — ALBUTEROL SULFATE HFA 108 (90 BASE) MCG/ACT IN AERS
2.0000 | INHALATION_SPRAY | Freq: Once | RESPIRATORY_TRACT | Status: AC
  Administered 2019-02-19: 2 via RESPIRATORY_TRACT
  Filled 2019-02-19: qty 6.7

## 2019-02-19 MED ORDER — SODIUM CHLORIDE 0.9 % IV BOLUS
1000.0000 mL | Freq: Once | INTRAVENOUS | Status: AC
  Administered 2019-02-19: 1000 mL via INTRAVENOUS

## 2019-02-19 MED ORDER — ONDANSETRON HCL 4 MG/2ML IJ SOLN
4.0000 mg | Freq: Once | INTRAMUSCULAR | Status: AC
  Administered 2019-02-19: 4 mg via INTRAVENOUS
  Filled 2019-02-19: qty 2

## 2019-02-19 NOTE — ED Triage Notes (Addendum)
Pt reports negative COVID test on Friday.  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  C/o headache, body aches, nausea, and diarrhea x 1 1/2 weeks.

## 2019-02-19 NOTE — Discharge Instructions (Signed)
You have tested POSITIVE for covid 19 today. Your labwork was otherwise reassuring. I have prescribed medication for coughing - take as prescribed.  I have written you out of work for the next 10 days so you can rest. It is recommended that you stay home and self isolate for 10 days from symptom onset although it appears you have been having symptoms for > 10 days. We will have you rest for 10 days given you have continued to go to work with likely having covid.  Please follow up with your PCP regarding your COVID 19 status.  Increase your fluid intake to stay hydrated.

## 2019-02-19 NOTE — ED Provider Notes (Signed)
[facility] EMERGENCY DEPARTMENT Provider Note   CSN: [id number] Arrival date & time: 02/19/19  1122     History Chief Complaint  Patient presents with   Diarrhea   Headache    Sabrina Brooks is a 59 y.o. female with PMHx HTN and diabetes who presents to the ED today complaining gradual onset, constant, dry cough x 1.5-2 weeks. Pt also complains of headache and body aches. She reports she began having diarrhea this morning. Pt works at a [employer] and reports they get tested twice weekly. She reports she was most recently tested on Friday 1/08 and it was negative. Pt did not receive her flu vaccine this year. She has not taken anything for her symptoms. She reports she has had chills but has been checking her temperature at home without elevation. She denies chest pain, shortness of breath, sore throat, ear pain, abdominal pain, vomiting, blood in stool, or any other associated symptoms.   The history is provided by the patient.       Past Medical History:  Diagnosis Date   Asthma    Chest pain    Diabetes mellitus    Hypertension    Obesity    Palpitation     Patient Active Problem List   Diagnosis Date Noted   Palpitations 06/09/2011   Chest pain 06/09/2011   Diabetes mellitus 06/09/2011    Past Surgical History:  Procedure Laterality Date   ECTOPIC PREGNANCY SURGERY  1990   ECTOPIC PREGNANCY SURGERY       OB History   No obstetric history on file.     Family History  Family history unknown: Yes    Social History   Tobacco Use   Smoking status: Never Smoker   Smokeless tobacco: Never Used  Substance Use Topics   Alcohol use: Yes    Comment: social drinker   Drug use: No    Home Medications Prior to Admission medications   Medication Sig Start Date End Date Taking? Authorizing Provider  benzonatate (TESSALON) 100 MG capsule Take 1 capsule (100 mg total) by mouth every 8 (eight) hours. 02/19/19   Alroy Bailiff,  Valentino Saavedra, PA-C  glucose blood test strip Check your sugar in the morning before you eat breakfast, and one hour after a meal. 07/28/18   Melynda Ripple, MD  glucose monitoring kit (FREESTYLE) monitoring kit 1 each by Does not apply route daily. Check glucose once in the morning before breakfast and 1 hour after a meal 07/28/18   Melynda Ripple, MD  metFORMIN (GLUCOPHAGE) 500 MG tablet Take 1 tablet (500 mg total) by mouth 2 (two) times daily with a meal. 04/28/13 04/28/14  Wendall Papa, MD  naproxen (NAPROSYN) 500 MG tablet Take 1 tablet (500 mg total) by mouth 2 (two) times daily. 01/02/19   Recardo Evangelist, PA-C    Allergies    Patient has no known allergies.  Review of Systems   Review of Systems  Constitutional: Positive for chills and fatigue. Negative for fever.  HENT: Negative for congestion.   Eyes: Negative for visual disturbance.  Respiratory: Positive for cough. Negative for shortness of breath.   Cardiovascular: Negative for chest pain.  Gastrointestinal: Positive for diarrhea and nausea. Negative for abdominal pain, blood in stool, constipation and vomiting.  Genitourinary: Negative for difficulty urinating.  Musculoskeletal: Positive for myalgias. Negative for neck pain and neck stiffness.  Skin: Negative for rash.  Neurological: Positive for headaches. Negative for dizziness, syncope, speech difficulty, weakness, light-headedness and  numbness.    Physical Exam Updated Vital Signs BP 119/88    Pulse 99    Temp 99.4 F (37.4 C) (Oral)    Resp 16    SpO2 99%   Physical Exam Vitals and nursing note reviewed.  Constitutional:      Appearance: She is not ill-appearing or diaphoretic.  HENT:     Head: Normocephalic and atraumatic.  Eyes:     Extraocular Movements: Extraocular movements intact.     Conjunctiva/sclera: Conjunctivae normal.     Pupils: Pupils are equal, round, and reactive to light.  Neck:     Meningeal: Brudzinski's sign and Kernig's sign absent.    Cardiovascular:     Rate and Rhythm: Normal rate and regular rhythm.     Heart sounds: Normal heart sounds.  Pulmonary:     Effort: Pulmonary effort is normal.     Breath sounds: Normal breath sounds. No wheezing, rhonchi or rales.  Abdominal:     Palpations: Abdomen is soft.     Tenderness: There is no abdominal tenderness. There is no guarding.  Musculoskeletal:     Cervical back: Neck supple.  Skin:    General: Skin is warm and dry.  Neurological:     Mental Status: She is alert.     GCS: GCS eye subscore is 4. GCS verbal subscore is 5. GCS motor subscore is 6.     Comments: CN 3-12 grossly intact A&O x4 GCS 15 Sensation and strength intact Coordination with finger-to-nose WNL Neg romberg, neg pronator drift     ED Results / Procedures / Treatments   Labs (all labs ordered are listed, but only abnormal results are displayed) Labs Reviewed  COMPREHENSIVE METABOLIC PANEL - Abnormal; Notable for the following components:      Result Value   Sodium 134 (*)    Glucose, Bld 285 (*)    All other components within normal limits  CBC - Abnormal; Notable for the following components:   WBC 3.9 (*)    All other components within normal limits  URINALYSIS, ROUTINE W REFLEX MICROSCOPIC - Abnormal; Notable for the following components:   APPearance HAZY (*)    Glucose, UA >=500 (*)    Ketones, ur 5 (*)    Protein, ur 30 (*)    All other components within normal limits  POC SARS CORONAVIRUS 2 AG -  ED - Abnormal; Notable for the following components:   SARS Coronavirus 2 Ag POSITIVE (*)    All other components within normal limits  LIPASE, BLOOD    EKG None  Radiology DG Chest Port 1 View  Result Date: 02/19/2019 CLINICAL DATA:  Cough and shortness of breath EXAM: PORTABLE CHEST 1 VIEW COMPARISON:  April 10, 2017 FINDINGS: Lungs are clear. Heart size and pulmonary vascularity are normal. No adenopathy. No bone lesions. IMPRESSION: No edema or consolidation.  No adenopathy.  Electronically Signed   By: Lowella Grip III M.D.   On: 02/19/2019 13:24    Procedures Procedures (including critical care time)  Medications Ordered in ED Medications  sodium chloride flush (NS) 0.9 % injection 3 mL (3 mLs Intravenous Given 02/19/19 1302)  sodium chloride 0.9 % bolus 1,000 mL (0 mLs Intravenous Stopped 02/19/19 1532)  ondansetron (ZOFRAN) injection 4 mg (4 mg Intravenous Given 02/19/19 1300)  albuterol (VENTOLIN HFA) 108 (90 Base) MCG/ACT inhaler 2 puff (2 puffs Inhalation Given 02/19/19 1528)    ED Course  I have reviewed the triage vital signs and the nursing  notes.  Pertinent labs & imaging results that were available during my care of the patient were reviewed by me and considered in my medical decision making (see chart for details).  59 year old female presents the ED today complaining of of, body aches, headache, diarrhea for the past 2 weeks.  Works at [employer] and gets tested daily, reports she tested negative on 1/08.  She began having diarrhea today came to the ED for further evaluation.  On arrival patient's temp is 99.4.  She is mildly tachycardic in the low 100s/high 90s.  She is nontachypneic.  Normotensive blood pressure.  She is satting 99% on room air without accessory muscle use.  He is actively coughing in the room.  Will obtain chest x-ray at this time.  Will obtain screening labs to ensure there are no electrolyte derangements with diarrhea, suspect patient may be dehydrated.  Will give fluids.  Will swab with rapid Covid test.  If negative will do 2-hour Covid and flu test.  High suspicion for Covid at this time as patient works in [employer] with rampant Covid patients.  Suspect she will be discharged home.   Rapid Covid test positive.  CBC with leukopenia 3.9 consistent with Covid.  CMP with mild decrease in sodium at 134, glucose is elevated at 285 although no anion gap today.  Creatinine 0.77 no elevation in BUN to suggest dehydration.  Lipase  within normal limits.  Still awaiting urinalysis at this time.  Chest x-ray clear.   U/A without infection. Pt to be discharged home at this time. She is to self isolate for 10 days given new covid diagnosis today although she has had symptoms for almost 2 weeks now. She is in agreement to stay home and self isolate. Work note has been given. Will prescribe tessalon perles for symptomatic relief of her cough. Albuterol inhaler given prior to discharge as well. Pt advised to follow up with PCP. She is in agreement with plan and stable for discharge home.   This note was prepared using Dragon voice recognition software and may include unintentional dictation errors due to the inherent limitations of voice recognition software.  Sabrina Brooks was evaluated in Emergency Department on 02/19/2019 for the symptoms described in the history of present illness. She was evaluated in the context of the global COVID-19 pandemic, which necessitated consideration that the patient might be at risk for infection with the SARS-CoV-2 virus that causes COVID-19. Institutional protocols and algorithms that pertain to the evaluation of patients at risk for COVID-19 are in a state of rapid change based on information released by regulatory bodies including the CDC and federal and state organizations. These policies and algorithms were followed during the patient's care in the ED.  Clinical Course as of Feb 18 1746  Sun Feb 19, 2019  1252 WBC(!): 3.9 [MV]  1322 SARS Coronavirus 2 Ag(!): POSITIVE [MV]    Clinical Course User Index [MV] Eustaquio Maize, PA-C   MDM Rules/Calculators/A&P                       Final Clinical Impression(s) / ED Diagnoses Final diagnoses:  COVID-19  Cough    Rx / DC Orders ED Discharge Orders         Ordered    benzonatate (TESSALON) 100 MG capsule  Every 8 hours     02/19/19 [place], [place], PA-C 02/19/19 1748  Lucrezia Starch, MD 02/20/19 [phone]

## 2019-05-30 ENCOUNTER — Ambulatory Visit (HOSPITAL_COMMUNITY)
Admission: EM | Admit: 2019-05-30 | Discharge: 2019-05-30 | Disposition: A | Discharge status: Home / Self Care | Payer: PRIVATE HEALTH INSURANCE | Attending: Physician Assistant | Admitting: Physician Assistant

## 2019-05-30 ENCOUNTER — Other Ambulatory Visit: Payer: Self-pay

## 2019-05-30 ENCOUNTER — Encounter (HOSPITAL_COMMUNITY): Payer: Self-pay

## 2019-05-30 DIAGNOSIS — Z7984 Long term (current) use of oral hypoglycemic drugs: Secondary | ICD-10-CM | POA: Diagnosis not present

## 2019-05-30 DIAGNOSIS — L601 Onycholysis: Secondary | ICD-10-CM | POA: Insufficient documentation

## 2019-05-30 DIAGNOSIS — Z791 Long term (current) use of non-steroidal anti-inflammatories (NSAID): Secondary | ICD-10-CM | POA: Insufficient documentation

## 2019-05-30 DIAGNOSIS — E119 Type 2 diabetes mellitus without complications: Secondary | ICD-10-CM | POA: Insufficient documentation

## 2019-05-30 DIAGNOSIS — N760 Acute vaginitis: Secondary | ICD-10-CM | POA: Insufficient documentation

## 2019-05-30 DIAGNOSIS — N950 Postmenopausal bleeding: Secondary | ICD-10-CM | POA: Insufficient documentation

## 2019-05-30 DIAGNOSIS — I10 Essential (primary) hypertension: Secondary | ICD-10-CM | POA: Insufficient documentation

## 2019-05-30 MED ORDER — FLUCONAZOLE 150 MG PO TABS: 150.0000 mg | ORAL_TABLET | Freq: Every day | ORAL | 0 refills | Status: AC

## 2019-05-30 MED ORDER — METRONIDAZOLE 500 MG PO TABS: 500.0000 mg | ORAL_TABLET | Freq: Two times a day (BID) | ORAL | 0 refills | Status: DC

## 2019-05-30 NOTE — ED Provider Notes (Signed)
[place]    CSN: [id number] Arrival date & time: 05/30/19  1537      History   Chief Complaint Chief Complaint  Patient presents with  . Finger Injury    HPI Sabrina Brooks is a 59 y.o. female.   Patient presents for evaluation of finger injury and concern about a yeast infection.  She reports for 5 days ago she had her nails done where she had fake nails placed and she noticed on her right pointer finger she developed white swelling around the tip of the finger.  This became painful and quite swollen.  After a few days it seemed to burst and drained a little bit of purulent discharge.  That point the skin cracked in her nail became loose.  She reports after drainage pain significantly improved and has minimal pain at this time.  She denies any fevers or chills.  Denies any redness or swelling since initial draining.  She did not injure the finger.   She is also concerned about a yeast infection as she has these frequently due to her diabetes.  She reports a white discharge with an odor that she cannot describe.  She describes itching and irritation.  Denies vaginal pain.  She is not sexually active.  She does report she is postmenopausal.  She reports some on and off spotting bleeding since becoming postmenopausal.  She does not have regular GYN care.  Patient also does not have a primary care.       Past Medical History:  Diagnosis Date  . Asthma   . Chest pain   . Diabetes mellitus   . Hypertension   . Obesity   . Palpitation     Patient Active Problem List   Diagnosis Date Noted  . Palpitations 06/09/2011  . Chest pain 06/09/2011  . Diabetes mellitus 06/09/2011    Past Surgical History:  Procedure Laterality Date  . ECTOPIC PREGNANCY SURGERY  1990  . ECTOPIC PREGNANCY SURGERY      OB History   No obstetric history on file.      Home Medications    Prior to Admission medications   Medication Sig Start Date End Date Taking? Authorizing  Provider  benzonatate (TESSALON) 100 MG capsule Take 1 capsule (100 mg total) by mouth every 8 (eight) hours. 02/19/19   Alroy Bailiff, Margaux, PA-C  fluconazole (DIFLUCAN) 150 MG tablet Take 1 tablet (150 mg total) by mouth daily for 2 days. Take 1 diflucan today and 1 following completion of metronidazole 05/30/19 06/01/19  Dyonna Jaspers, Marguerita Beards, PA-C  glucose blood test strip Check your sugar in the morning before you eat breakfast, and one hour after a meal. 07/28/18   Melynda Ripple, MD  glucose monitoring kit (FREESTYLE) monitoring kit 1 each by Does not apply route daily. Check glucose once in the morning before breakfast and 1 hour after a meal 07/28/18   Melynda Ripple, MD  metFORMIN (GLUCOPHAGE) 500 MG tablet Take 1 tablet (500 mg total) by mouth 2 (two) times daily with a meal. 04/28/13 04/28/14  Wendall Papa, MD  metroNIDAZOLE (FLAGYL) 500 MG tablet Take 1 tablet (500 mg total) by mouth 2 (two) times daily. 05/30/19   Dayonna Selbe, Marguerita Beards, PA-C  naproxen (NAPROSYN) 500 MG tablet Take 1 tablet (500 mg total) by mouth 2 (two) times daily. 01/02/19   Recardo Evangelist, PA-C    Family History Family History  Family history unknown: Yes    Social History Social History  Tobacco Use  . Smoking status: Never Smoker  . Smokeless tobacco: Never Used  Substance Use Topics  . Alcohol use: Yes    Comment: social drinker  . Drug use: No     Allergies   Patient has no known allergies.   Review of Systems Review of Systems Per HPI  Physical Exam Triage Vital Signs ED Triage Vitals [05/30/19 1610]  Enc Vitals Group     BP (!) 150/88     Pulse Rate 76     Resp 16     Temp 97.6 F (36.4 C)     Temp Source Oral     SpO2 97 %     Weight 197 lb (89.4 kg)     Height '5\' 4"'$  (1.626 m)     Head Circumference      Peak Flow      Pain Score 0     Pain Loc      Pain Edu?      Excl. in [place]?    No data found.  Updated Vital Signs BP (!) 150/88   Pulse 76   Temp 97.6 F (36.4 C) (Oral)   Resp  16   Ht '5\' 4"'$  (1.626 m)   Wt 197 lb (89.4 kg)   SpO2 97%   BMI 33.81 kg/m   Visual Acuity Right Eye Distance:   Left Eye Distance:   Bilateral Distance:    Right Eye Near:   Left Eye Near:    Bilateral Near:     Physical Exam Vitals and nursing note reviewed.  Constitutional:      General: She is not in acute distress.    Appearance: She is well-developed.  HENT:     Head: Normocephalic and atraumatic.  Cardiovascular:     Rate and Rhythm: Normal rate.  Pulmonary:     Effort: Pulmonary effort is normal. No respiratory distress.  Genitourinary:    Comments: Deferred for self swab. Musculoskeletal:     Cervical back: Neck supple.     Comments: Right second finger with crack around skin of the medial aspect of the nail fold.  Nail is detached from nail bed however connected at cuticle.  No nailbed injury visible.  No evidence of erythema or swelling.  No tenderness to palpation.  No bony tenderness.  No spreading erythema.  Patient does have limited active range of motion full flexion however does have full range of motion with passive flexion with no reported pain.  Skin:    General: Skin is warm and dry.  Neurological:     Mental Status: She is alert.      UC Treatments / Results  Labs (all labs ordered are listed, but only abnormal results are displayed) Labs Reviewed  CERVICOVAGINAL ANCILLARY ONLY    EKG   Radiology No results found.  Procedures Procedures (including critical care time)  Medications Ordered in UC Medications - No data to display  Initial Impression / Assessment and Plan / UC Course  I have reviewed the triage vital signs and the nursing notes.  Pertinent labs & imaging results that were available during my care of the patient were reviewed by me and considered in my medical decision making (see chart for details).     #Attached nail #Vaginitis and vulvovaginitis #Postmenopausal bleeding Patient is a 59 year old presenting with  detached nail likely secondary to healed paronychia or possible felon.  There is no sign of infection at this time.  We will keep finger wrapped  to keep nail in place until regrowth begins.  Patient given primary care follow-up for reevaluation.   -Vaginal symptoms are consistent with either bacterial vaginosis or yeast infection.  We will treat both today.  Given postmenopausal bleeding patient was instructed to follow-up with gynecology as she should have further evaluation of this.  Patient verbalized understanding this. -Patient to return if finger pain or swelling. Final Clinical Impressions(s) / UC Diagnoses   Final diagnoses:  Vaginitis and vulvovaginitis  Detachment of nail  Postmenopausal bleeding     Discharge Instructions     Keep the finger wrapped with coban wrap. Allowing the nail to grow out If it becomes painful, you notice discharge please return  Please schedule follow up with primary care for further evaluation  Please follow up with one of the [place] offices for evaluation of post menopausal bleeding. I would like for you to call them tomorrow to discuss an appointment.     ED Prescriptions    Medication Sig Dispense Auth. Provider   metroNIDAZOLE (FLAGYL) 500 MG tablet Take 1 tablet (500 mg total) by mouth 2 (two) times daily. 14 tablet Armand Preast, Marguerita Beards, PA-C   fluconazole (DIFLUCAN) 150 MG tablet Take 1 tablet (150 mg total) by mouth daily for 2 days. Take 1 diflucan today and 1 following completion of metronidazole 2 tablet Brandace Cargle, Marguerita Beards, PA-C     PDMP not reviewed this encounter.   Purnell Shoemaker, PA-C 05/30/19 1726

## 2019-05-30 NOTE — Discharge Instructions (Addendum)
Keep the finger wrapped with coban wrap. Allowing the nail to grow out If it becomes painful, you notice discharge please return  Please schedule follow up with primary care for further evaluation  Please follow up with one of the Gyn offices for evaluation of post menopausal bleeding. I would like for you to call them tomorrow to discuss an appointment.

## 2019-05-30 NOTE — ED Triage Notes (Signed)
Pt states she got her nails done then 4 days after the skin on her 2nd left digit turned white and the skin is near her nail is cracked and pt is able to lift her entire nail up from her fingerx4 days. Pt states she also has a yeast inf. Pt has vaginal itchingx1 wk. Pt states thick white discharge.

## 2019-05-31 ENCOUNTER — Telehealth (HOSPITAL_COMMUNITY): Payer: Self-pay

## 2019-05-31 LAB — CERVICOVAGINAL ANCILLARY ONLY
Bacterial Vaginitis (gardnerella): POSITIVE — AB
Candida Glabrata: NEGATIVE
Candida Vaginitis: POSITIVE — AB
Chlamydia: POSITIVE — AB
Comment: NEGATIVE
Comment: NEGATIVE
Comment: NEGATIVE
Comment: NEGATIVE
Comment: NEGATIVE
Comment: NORMAL
Neisseria Gonorrhea: NEGATIVE
Trichomonas: NEGATIVE

## 2019-05-31 MED ORDER — AZITHROMYCIN 250 MG PO TABS: 250.0000 mg | ORAL_TABLET | Freq: Once | ORAL | 0 refills | Status: AC

## 2019-09-25 ENCOUNTER — Other Ambulatory Visit: Payer: Self-pay

## 2019-09-25 ENCOUNTER — Ambulatory Visit (HOSPITAL_COMMUNITY)
Admission: EM | Admit: 2019-09-25 | Discharge: 2019-09-25 | Disposition: A | Discharge status: Home / Self Care | Payer: PRIVATE HEALTH INSURANCE | Attending: Family Medicine | Admitting: Family Medicine

## 2019-09-25 ENCOUNTER — Encounter (HOSPITAL_COMMUNITY): Payer: Self-pay | Admitting: Emergency Medicine

## 2019-09-25 DIAGNOSIS — S86912A Strain of unspecified muscle(s) and tendon(s) at lower leg level, left leg, initial encounter: Secondary | ICD-10-CM

## 2019-09-25 MED ORDER — MELOXICAM 7.5 MG PO TABS: 7.5000 mg | ORAL_TABLET | Freq: Every day | ORAL | 0 refills | Status: DC

## 2019-09-25 NOTE — Discharge Instructions (Signed)
Light and regular activity as tolerated.  Gentle stretching.  Ice application at end of day.  Meloxicam daily. Don't take additional ibuprofen for aleve. Take with food.  Follow up with orthopedics and/or sports medicine for any persistent or worsening symptoms.

## 2019-09-25 NOTE — ED Triage Notes (Signed)
Pt presents with leg pain after tripping over dress on Saturday. Denies swelling or bruising. States every now and then will get a sharp pain in left foot.   States Tylenol did not give any relief.   Pt states she has been without metformin for over 6 months. Does not check blood sugar regularly.

## 2019-09-25 NOTE — ED Provider Notes (Signed)
[place]    CSN: [id number] Arrival date & time: 09/25/19  1445      History   Chief Complaint Chief Complaint  Patient presents with  . Fall    HPI Sabrina Brooks is a 59 y.o. female.   Sabrina Brooks presents with complaints of left leg pain after a fall on 8/14. She was at a wedding. She stood up, her gown got caught on the chair, pulling her, causing her to slide and fall back. Left left got pulled back, bent at the knee. Now with left low back pain which radiates down left left. Sharp pain to top of foot. Walking worsens the pain. Bending doesn't worsen it, and hip flexion doesn't worsen the pain. Took tylenol for pain which didn't help. Denies any previous injury or pain to left knee or leg.    ROS per HPI, negative if not otherwise mentioned.      Past Medical History:  Diagnosis Date  . Asthma   . Chest pain   . Diabetes mellitus   . Hypertension   . Obesity   . Palpitation     Patient Active Problem List   Diagnosis Date Noted  . Palpitations 06/09/2011  . Chest pain 06/09/2011  . Diabetes mellitus 06/09/2011    Past Surgical History:  Procedure Laterality Date  . ECTOPIC PREGNANCY SURGERY  1990  . ECTOPIC PREGNANCY SURGERY      OB History   No obstetric history on file.      Home Medications    Prior to Admission medications   Medication Sig Start Date End Date Taking? Authorizing Provider  benzonatate (TESSALON) 100 MG capsule Take 1 capsule (100 mg total) by mouth every 8 (eight) hours. 02/19/19   Alroy Bailiff, Margaux, PA-C  glucose blood test strip Check your sugar in the morning before you eat breakfast, and one hour after a meal. 07/28/18   Melynda Ripple, MD  glucose monitoring kit (FREESTYLE) monitoring kit 1 each by Does not apply route daily. Check glucose once in the morning before breakfast and 1 hour after a meal 07/28/18   Melynda Ripple, MD  meloxicam (MOBIC) 7.5 MG tablet Take 1 tablet (7.5 mg total) by mouth  daily. 09/25/19   Zigmund Gottron, NP  metFORMIN (GLUCOPHAGE) 500 MG tablet Take 1 tablet (500 mg total) by mouth 2 (two) times daily with a meal. 04/28/13 04/28/14  Pippin, Carlyon Prows, MD  metroNIDAZOLE (FLAGYL) 500 MG tablet Take 1 tablet (500 mg total) by mouth 2 (two) times daily. 05/30/19   Darr, Marguerita Beards, PA-C  naproxen (NAPROSYN) 500 MG tablet Take 1 tablet (500 mg total) by mouth 2 (two) times daily. 01/02/19   Recardo Evangelist, PA-C    Family History Family History  Problem Relation Age of Onset  . Dementia Mother     Social History Social History   Tobacco Use  . Smoking status: Never Smoker  . Smokeless tobacco: Never Used  Substance Use Topics  . Alcohol use: Yes    Comment: social drinker  . Drug use: No     Allergies   Patient has no known allergies.   Review of Systems Review of Systems   Physical Exam Triage Vital Signs ED Triage Vitals  Enc Vitals Group     BP 09/25/19 1624 130/75     Pulse Rate 09/25/19 1624 78     Resp 09/25/19 1624 18     Temp 09/25/19 1624 (!) 97.5 F (36.4 C)  Temp Source 09/25/19 1624 Oral     SpO2 09/25/19 1624 99 %     Weight --      Height --      Head Circumference --      Peak Flow --      Pain Score 09/25/19 1621 9     Pain Loc --      Pain Edu? --      Excl. in [place]? --    No data found.  Updated Vital Signs BP 130/75 (BP Location: Right Arm)   Pulse 78   Temp (!) 97.5 F (36.4 C) (Oral)   Resp 18   SpO2 99%   Visual Acuity Right Eye Distance:   Left Eye Distance:   Bilateral Distance:    Right Eye Near:   Left Eye Near:    Bilateral Near:     Physical Exam Constitutional:      General: She is not in acute distress.    Appearance: She is well-developed.  Cardiovascular:     Rate and Rhythm: Normal rate.  Pulmonary:     Effort: Pulmonary effort is normal.  Musculoskeletal:     Lumbar back: No tenderness or bony tenderness. Normal range of motion. Negative right straight leg raise test and  negative left straight leg raise test.     Left lower leg: No swelling, tenderness or bony tenderness. No edema.     Comments: No bony tenderness to left knee; mild pain with knee flexion; no effusion, no bruising; no obvious laxity to knee; ambulatory without difficulty; strength equal bilaterally; gross sensation intact to lower extremities   Skin:    General: Skin is warm and dry.  Neurological:     Mental Status: She is alert and oriented to person, place, and time.      UC Treatments / Results  Labs (all labs ordered are listed, but only abnormal results are displayed) Labs Reviewed - No data to display  EKG   Radiology No results found.  Procedures Procedures (including critical care time)  Medications Ordered in UC Medications - No data to display  Initial Impression / Assessment and Plan / UC Course  I have reviewed the triage vital signs and the nursing notes.  Pertinent labs & imaging results that were available during my care of the patient were reviewed by me and considered in my medical decision making (see chart for details).     Left knee and low back strain s/p fall. Pain management discussed. Follow up discussed and recommended. Return precautions provided. Patient verbalized understanding and agreeable to plan.  Ambulatory out of clinic without difficulty.     Final Clinical Impressions(s) / UC Diagnoses   Final diagnoses:  Strain of left knee and leg, initial encounter     Discharge Instructions     Light and regular activity as tolerated.  Gentle stretching.  Ice application at end of day.  Meloxicam daily. Don't take additional ibuprofen for aleve. Take with food.  Follow up with orthopedics and/or sports medicine for any persistent or worsening symptoms.     ED Prescriptions    Medication Sig Dispense Auth. Provider   meloxicam (MOBIC) 7.5 MG tablet Take 1 tablet (7.5 mg total) by mouth daily. 20 tablet Zigmund Gottron, NP     PDMP not  reviewed this encounter.   Zigmund Gottron, NP 09/27/19 [phone]

## 2019-10-18 ENCOUNTER — Other Ambulatory Visit: Payer: Self-pay

## 2019-10-18 ENCOUNTER — Encounter (HOSPITAL_COMMUNITY): Payer: Self-pay

## 2019-10-18 ENCOUNTER — Ambulatory Visit (INDEPENDENT_AMBULATORY_CARE_PROVIDER_SITE_OTHER): Payer: PRIVATE HEALTH INSURANCE

## 2019-10-18 ENCOUNTER — Ambulatory Visit (HOSPITAL_COMMUNITY)
Admission: EM | Admit: 2019-10-18 | Discharge: 2019-10-18 | Disposition: A | Discharge status: Home / Self Care | Payer: PRIVATE HEALTH INSURANCE | Attending: Family Medicine | Admitting: Family Medicine

## 2019-10-18 DIAGNOSIS — J069 Acute upper respiratory infection, unspecified: Secondary | ICD-10-CM

## 2019-10-18 DIAGNOSIS — R0602 Shortness of breath: Secondary | ICD-10-CM

## 2019-10-18 DIAGNOSIS — R05 Cough: Secondary | ICD-10-CM | POA: Diagnosis not present

## 2019-10-18 MED ORDER — BENZONATATE 200 MG PO CAPS: 200.0000 mg | ORAL_CAPSULE | Freq: Three times a day (TID) | ORAL | 0 refills | Status: DC | PRN

## 2019-10-18 MED ORDER — BUDESONIDE-FORMOTEROL FUMARATE 160-4.5 MCG/ACT IN AERO: 2.0000 | INHALATION_SPRAY | Freq: Two times a day (BID) | RESPIRATORY_TRACT | 0 refills | Status: DC

## 2019-10-18 MED ORDER — AZITHROMYCIN 250 MG PO TABS: ORAL_TABLET | ORAL | 0 refills | Status: DC

## 2019-10-18 NOTE — ED Triage Notes (Signed)
Pt presents with cough x 2 weeks. States having a negative COVID tweets today at work. Nurse at work told she sound like having pneumonia or bronchitis. Pt denies fever, sob.

## 2019-10-18 NOTE — ED Provider Notes (Signed)
[place]    CSN: [id number] Arrival date & time: 10/18/19  1547      History   Chief Complaint Chief Complaint  Patient presents with   Cough    HPI Sabrina Brooks is a 59 y.o. female.   Presenting today with over 2 weeks of fairly dry, hacking cough and SOB. Denies any other associated sxs, including congestion, fever, chills, body aches, abdominal pain, N/V/D, exposures to sick contacts. Denies smoking hx or hx of pulmonary dz. Neg COVID test today at work. Not trying anything OTC for sxs.      Past Medical History:  Diagnosis Date   Asthma    Chest pain    Diabetes mellitus    Hypertension    Obesity    Palpitation     Patient Active Problem List   Diagnosis Date Noted   Palpitations 06/09/2011   Chest pain 06/09/2011   Diabetes mellitus 06/09/2011    Past Surgical History:  Procedure Laterality Date   ECTOPIC PREGNANCY SURGERY  1990   ECTOPIC PREGNANCY SURGERY      OB History   No obstetric history on file.      Home Medications    Prior to Admission medications   Medication Sig Start Date End Date Taking? Authorizing Provider  azithromycin (ZITHROMAX) 250 MG tablet As directed 10/18/19   Volney American, PA-C  benzonatate (TESSALON) 100 MG capsule Take 1 capsule (100 mg total) by mouth every 8 (eight) hours. 02/19/19   Venter, Margaux, PA-C  benzonatate (TESSALON) 200 MG capsule Take 1 capsule (200 mg total) by mouth 3 (three) times daily as needed for cough. 10/18/19   Volney American, PA-C  budesonide-formoterol [facility]) 160-4.5 MCG/ACT inhaler Inhale 2 puffs into the lungs 2 (two) times daily. 10/18/19   Volney American, PA-C  glucose blood test strip Check your sugar in the morning before you eat breakfast, and one hour after a meal. 07/28/18   Melynda Ripple, MD  glucose monitoring kit (FREESTYLE) monitoring kit 1 each by Does not apply route daily. Check glucose once in the morning before breakfast  and 1 hour after a meal 07/28/18   Melynda Ripple, MD  meloxicam (MOBIC) 7.5 MG tablet Take 1 tablet (7.5 mg total) by mouth daily. 09/25/19   Zigmund Gottron, NP  metFORMIN (GLUCOPHAGE) 500 MG tablet Take 1 tablet (500 mg total) by mouth 2 (two) times daily with a meal. 04/28/13 04/28/14  Pippin, Carlyon Prows, MD  metroNIDAZOLE (FLAGYL) 500 MG tablet Take 1 tablet (500 mg total) by mouth 2 (two) times daily. 05/30/19   Darr, Marguerita Beards, PA-C  naproxen (NAPROSYN) 500 MG tablet Take 1 tablet (500 mg total) by mouth 2 (two) times daily. 01/02/19   Recardo Evangelist, PA-C    Family History Family History  Problem Relation Age of Onset   Dementia Mother     Social History Social History   Tobacco Use   Smoking status: Never Smoker   Smokeless tobacco: Never Used  Substance Use Topics   Alcohol use: Yes    Comment: social drinker   Drug use: No     Allergies   Patient has no known allergies.   Review of Systems Review of Systems PER HPI   Physical Exam Triage Vital Signs ED Triage Vitals  Enc Vitals Group     BP 10/18/19 1803 (!) 149/116     Pulse Rate 10/18/19 1803 83     Resp 10/18/19 1803 19  Temp 10/18/19 1803 98 F (36.7 C)     Temp Source 10/18/19 1803 Oral     SpO2 10/18/19 1803 98 %     Weight --      Height --      Head Circumference --      Peak Flow --      Pain Score 10/18/19 1802 0     Pain Loc --      Pain Edu? --      Excl. in [place]? --    No data found.  Updated Vital Signs BP (!) 149/116 (BP Location: Left Arm)    Pulse 83    Temp 98 F (36.7 C) (Oral)    Resp 19    SpO2 98%   Visual Acuity Right Eye Distance:   Left Eye Distance:   Bilateral Distance:    Right Eye Near:   Left Eye Near:    Bilateral Near:     Physical Exam Vitals and nursing note reviewed.  Constitutional:      Appearance: Normal appearance. She is not ill-appearing.  HENT:     Head: Atraumatic.     Right Ear: Tympanic membrane normal.     Left Ear: Tympanic  membrane normal.     Nose: Nose normal. No congestion.     Mouth/Throat:     Mouth: Mucous membranes are moist.     Pharynx: Oropharynx is clear. No posterior oropharyngeal erythema.  Eyes:     Extraocular Movements: Extraocular movements intact.     Conjunctiva/sclera: Conjunctivae normal.  Cardiovascular:     Rate and Rhythm: Normal rate and regular rhythm.     Heart sounds: Normal heart sounds.  Pulmonary:     Effort: Pulmonary effort is normal. No respiratory distress.     Breath sounds: Wheezing (b/l worst at bases) present.  Abdominal:     General: Bowel sounds are normal. There is no distension.     Palpations: Abdomen is soft.     Tenderness: There is no abdominal tenderness. There is no right CVA tenderness, left CVA tenderness or guarding.  Musculoskeletal:        General: Normal range of motion.     Cervical back: Normal range of motion and neck supple.  Skin:    General: Skin is warm and dry.  Neurological:     Mental Status: She is alert and oriented to person, place, and time.  Psychiatric:        Mood and Affect: Mood normal.        Thought Content: Thought content normal.        Judgment: Judgment normal.    UC Treatments / Results  Labs (all labs ordered are listed, but only abnormal results are displayed) Labs Reviewed - No data to display  EKG   Radiology DG Chest 2 View  Result Date: 10/18/2019 CLINICAL DATA:  Cough and shortness of breath for 2 weeks. Wheezing. EXAM: CHEST - 2 VIEW COMPARISON:  02/19/2019 FINDINGS: The cardiomediastinal contours are normal. The lungs are clear. Pulmonary vasculature is normal. No consolidation, pleural effusion, or pneumothorax. No acute osseous abnormalities are seen. IMPRESSION: No acute chest findings or explanation for cough. Electronically Signed   By: Keith Rake M.D.   On: 10/18/2019 18:49    Procedures Procedures (including critical care time)  Medications Ordered in UC Medications - No data to  display  Initial Impression / Assessment and Plan / UC Course  I have reviewed the triage vital signs and  the nursing notes.  Pertinent labs & imaging results that were available during my care of the patient were reviewed by me and considered in my medical decision making (see chart for details).     CXR without acute abnormality today, but given duration and worsening course as well as her marked wheezes and chest congestion on exam, will treat with zpak, symbicort, and phenergan DM cough syrup. She is aware of proper inhaler use, may d/c once sxs resolve. Mucinex also recommended BID. Follow up if sxs worsening or not improving.    Final Clinical Impressions(s) / UC Diagnoses   Final diagnoses:  Upper respiratory tract infection, unspecified type   Discharge Instructions   None    ED Prescriptions    Medication Sig Dispense Auth. Provider   azithromycin (ZITHROMAX) 250 MG tablet As directed 6 tablet Volney American, PA-C   budesonide-formoterol [facility]) 160-4.5 MCG/ACT inhaler Inhale 2 puffs into the lungs 2 (two) times daily. 1 each Volney American, PA-C   benzonatate (TESSALON) 200 MG capsule Take 1 capsule (200 mg total) by mouth 3 (three) times daily as needed for cough. 20 capsule Volney American, [place]     PDMP not reviewed this encounter.   Volney American, [place] 10/18/19 2142

## 2020-08-19 ENCOUNTER — Other Ambulatory Visit: Payer: Self-pay

## 2020-08-19 ENCOUNTER — Emergency Department (HOSPITAL_COMMUNITY): Payer: PRIVATE HEALTH INSURANCE

## 2020-08-19 ENCOUNTER — Emergency Department (HOSPITAL_COMMUNITY)
Admission: EM | Admit: 2020-08-19 | Discharge: 2020-08-19 | Disposition: A | Discharge status: Home / Self Care | Payer: PRIVATE HEALTH INSURANCE | Attending: Emergency Medicine | Admitting: Emergency Medicine

## 2020-08-19 DIAGNOSIS — R0602 Shortness of breath: Secondary | ICD-10-CM | POA: Diagnosis not present

## 2020-08-19 DIAGNOSIS — I10 Essential (primary) hypertension: Secondary | ICD-10-CM | POA: Insufficient documentation

## 2020-08-19 DIAGNOSIS — E119 Type 2 diabetes mellitus without complications: Secondary | ICD-10-CM | POA: Diagnosis not present

## 2020-08-19 DIAGNOSIS — Z7951 Long term (current) use of inhaled steroids: Secondary | ICD-10-CM | POA: Diagnosis not present

## 2020-08-19 DIAGNOSIS — R519 Headache, unspecified: Secondary | ICD-10-CM | POA: Diagnosis not present

## 2020-08-19 DIAGNOSIS — R1032 Left lower quadrant pain: Secondary | ICD-10-CM | POA: Diagnosis not present

## 2020-08-19 DIAGNOSIS — Z7984 Long term (current) use of oral hypoglycemic drugs: Secondary | ICD-10-CM | POA: Insufficient documentation

## 2020-08-19 DIAGNOSIS — J45909 Unspecified asthma, uncomplicated: Secondary | ICD-10-CM | POA: Insufficient documentation

## 2020-08-19 DIAGNOSIS — H538 Other visual disturbances: Secondary | ICD-10-CM | POA: Insufficient documentation

## 2020-08-19 LAB — COMPREHENSIVE METABOLIC PANEL
ALT: 32 U/L (ref 0–44)
AST: 25 U/L (ref 15–41)
Albumin: 4.2 g/dL (ref 3.5–5.0)
Alkaline Phosphatase: 58 U/L (ref 38–126)
Anion gap: 8 (ref 5–15)
BUN: 16 mg/dL (ref 6–20)
CO2: 27 mmol/L (ref 22–32)
Calcium: 9.8 mg/dL (ref 8.9–10.3)
Chloride: 101 mmol/L (ref 98–111)
Creatinine, Ser: 0.82 mg/dL (ref 0.44–1.00)
GFR, Estimated: >60 mL/min (ref >60)
Glucose, Bld: 129 mg/dL — ABNORMAL HIGH (ref 70–99)
Potassium: 3.8 mmol/L (ref 3.5–5.1)
Sodium: 136 mmol/L (ref 135–145)
Total Bilirubin: 1 mg/dL (ref 0.3–1.2)
Total Protein: 7.6 g/dL (ref 6.5–8.1)

## 2020-08-19 LAB — URINALYSIS, ROUTINE W REFLEX MICROSCOPIC
Bacteria, UA: NONE SEEN
Bilirubin Urine: NEGATIVE
Glucose, UA: NEGATIVE mg/dL
Hgb urine dipstick: NEGATIVE
Ketones, ur: 5 mg/dL — AB
Leukocytes,Ua: NEGATIVE
Nitrite: NEGATIVE
Protein, ur: 30 mg/dL — AB
Specific Gravity, Urine: 1.025 (ref 1.005–1.030)
pH: 5 (ref 5.0–8.0)

## 2020-08-19 LAB — CBC WITH DIFFERENTIAL/PLATELET
Abs Immature Granulocytes: 0.01 10*3/uL (ref 0.00–0.07)
Basophils Absolute: 0 10*3/uL (ref 0.0–0.1)
Basophils Relative: 1 %
Eosinophils Absolute: 0.2 10*3/uL (ref 0.0–0.5)
Eosinophils Relative: 4 %
HCT: 43.2 % (ref 36.0–46.0)
Hemoglobin: 14 g/dL (ref 12.0–15.0)
Immature Granulocytes: 0 %
Lymphocytes Relative: 45 %
Lymphs Abs: 2.4 10*3/uL (ref 0.7–4.0)
MCH: 30.1 pg (ref 26.0–34.0)
MCHC: 32.4 g/dL (ref 30.0–36.0)
MCV: 92.9 fL (ref 80.0–100.0)
Monocytes Absolute: 0.5 10*3/uL (ref 0.1–1.0)
Monocytes Relative: 10 %
Neutro Abs: 2.2 10*3/uL (ref 1.7–7.7)
Neutrophils Relative %: 40 %
Platelets: 253 10*3/uL (ref 150–400)
RBC: 4.65 MIL/uL (ref 3.87–5.11)
RDW: 12 % (ref 11.5–15.5)
WBC: 5.4 10*3/uL (ref 4.0–10.5)
nRBC: 0 % (ref 0.0–0.2)

## 2020-08-19 MED ORDER — KETOROLAC TROMETHAMINE 60 MG/2ML IM SOLN: 60.0000 mg | Freq: Once | INTRAMUSCULAR | Status: DC

## 2020-08-19 MED ORDER — IBUPROFEN 400 MG PO TABS
600.0000 mg | ORAL_TABLET | Freq: Once | ORAL | Status: AC
  Administered 2020-08-19: 600 mg via ORAL
  Filled 2020-08-19: qty 1

## 2020-08-19 NOTE — ED Notes (Signed)
Pt stated her PCP dx UTI. Pt has not picked up the prescription yet.

## 2020-08-19 NOTE — Discharge Instructions (Addendum)
You were evaluated in the Emergency Department and after careful evaluation, we did not find any emergent condition requiring admission or further testing in the hospital.  Be sure to follow-up with your primary care doctor, you would likely benefit from evaluation from an eye doctor.  Please return to the Emergency Department if you experience any worsening of your condition. Thank you for allowing [place] to be a part of your care.

## 2020-08-19 NOTE — ED Triage Notes (Signed)
Pt sent from PCP for eval of vision changes/blurriness, SHOB since last night. Unable to see specialist, sent to be evaluated. Also endorses tingling to bilateral feet. States while she was in the waiting room, LLQ abd pain began as well.  10/10 headache, throbbing

## 2020-08-19 NOTE — ED Provider Notes (Signed)
Emergency Medicine Provider Triage Evaluation Note  Sabrina Brooks , a 60 y.o. female  was evaluated in triage.  Pt complains of SOB. Pt states that last night she began experiencing mild SOB while lying flat. Today it resolved.  While at work about 8 hours ago she began experiencing a throbbing frontal headache that radiates to her eyes.  She reports associated blurry vision.  No chest pain or vomiting.  Also complains of tingling in the bilateral feet.  No numbness or weakness.  Physical Exam  BP 139/88 (BP Location: Left Arm)   Pulse 75   Temp 97.7 F (36.5 C) (Oral)   Resp 16   SpO2 100%  Gen:   Awake, no distress   Resp:  Normal effort  MSK:   Moves extremities without difficulty  Other:  Strength is 5/5 in all 4 extremities.  Distal sensation intact.  Visual fields grossly intact.  Extraocular movements intact.  Pupils are equal, round, and reactive to light.  Speaking clearly, coherently, and in complete sentences.  No facial droop.  Medical Decision Making  Medically screening exam initiated at 4:25 PM.  Appropriate orders placed.  [name] was informed that the remainder of the evaluation will be completed by another provider, this initial triage assessment does not replace that evaluation, and the importance of remaining in the ED until their evaluation is complete.   Placido Sou, PA-C 08/19/20 1629    Milagros Loll, MD 08/20/20 1601

## 2020-08-19 NOTE — ED Provider Notes (Addendum)
[facility] EMERGENCY DEPARTMENT Provider Note   CSN: [id number] Arrival date & time: 08/19/20  1446     History Chief Complaint  Patient presents with   Vision Changes   Diabetes    Sabrina Brooks is a 60 y.o. female.  HPI 60 year old female with a history of asthma, DM type II, hypertension presents to the ER with complaints of headache and blurry vision.  Patient states that yesterday she is having some shortness of breath with lying down, however this is resolved throughout the night and she has not had any chest pain or shortness of breath since then.  She states overall she was feeling well, however she was at work earlier today and started to develop headache and bilateral blurry vision which seems to wax and wane.  She denies any numbness, states that she will occasionally have tingling in her feet which also seems to wax and wane.  Triage note left lower quadrant pain which developed in the waiting room, however the patient denies this to me, states she no longer has pain, no nausea, vomiting.  States that she was evaluated by her PCP today who recommended that she come to the ER for evaluation as they were not able to find a specialist today who could see her in the office.  Onset of symptoms was about 8 hours ago.  She is not on any anticoagulation.    Past Medical History:  Diagnosis Date   Asthma    Chest pain    Diabetes mellitus    Hypertension    Obesity    Palpitation     Patient Active Problem List   Diagnosis Date Noted   Palpitations 06/09/2011   Chest pain 06/09/2011   Diabetes mellitus 06/09/2011    Past Surgical History:  Procedure Laterality Date   ECTOPIC PREGNANCY SURGERY  1990   ECTOPIC PREGNANCY SURGERY       OB History   No obstetric history on file.     Family History  Problem Relation Age of Onset   Dementia Mother     Social History   Tobacco Use   Smoking status: Never   Smokeless tobacco: Never   Substance Use Topics   Alcohol use: Yes    Comment: social drinker   Drug use: No    Home Medications Prior to Admission medications   Medication Sig Start Date End Date Taking? Authorizing Provider  azithromycin (ZITHROMAX) 250 MG tablet As directed 10/18/19   Volney American, PA-C  benzonatate (TESSALON) 100 MG capsule Take 1 capsule (100 mg total) by mouth every 8 (eight) hours. 02/19/19   Venter, Margaux, PA-C  benzonatate (TESSALON) 200 MG capsule Take 1 capsule (200 mg total) by mouth 3 (three) times daily as needed for cough. 10/18/19   Volney American, PA-C  budesonide-formoterol [facility]) 160-4.5 MCG/ACT inhaler Inhale 2 puffs into the lungs 2 (two) times daily. 10/18/19   Volney American, PA-C  glucose blood test strip Check your sugar in the morning before you eat breakfast, and one hour after a meal. 07/28/18   Melynda Ripple, MD  glucose monitoring kit (FREESTYLE) monitoring kit 1 each by Does not apply route daily. Check glucose once in the morning before breakfast and 1 hour after a meal 07/28/18   Melynda Ripple, MD  meloxicam (MOBIC) 7.5 MG tablet Take 1 tablet (7.5 mg total) by mouth daily. 09/25/19   Zigmund Gottron, NP  metFORMIN (GLUCOPHAGE) 500 MG tablet Take 1  tablet (500 mg total) by mouth 2 (two) times daily with a meal. 04/28/13 04/28/14  Pippin, Carlyon Prows, MD  metroNIDAZOLE (FLAGYL) 500 MG tablet Take 1 tablet (500 mg total) by mouth 2 (two) times daily. 05/30/19   Darr, Edison Nasuti, PA-C  naproxen (NAPROSYN) 500 MG tablet Take 1 tablet (500 mg total) by mouth 2 (two) times daily. 01/02/19   Recardo Evangelist, PA-C    Allergies    Patient has no known allergies.  Review of Systems   Review of Systems Ten systems reviewed and are negative for acute change, except as noted in the HPI.   Physical Exam Updated Vital Signs BP 138/81   Pulse 62   Temp 98.8 F (37.1 C) (Oral)   Resp 11   Ht _0  (1.676 m)   Wt 84.8 kg   SpO2 100%   BMI 30.18 kg/m    Physical Exam Vitals and nursing note reviewed.  Constitutional:      General: She is not in acute distress.    Appearance: She is well-developed. She is not ill-appearing or diaphoretic.  HENT:     Head: Normocephalic and atraumatic.  Eyes:     Conjunctiva/sclera: Conjunctivae normal.     Pupils: Pupils are equal, round, and reactive to light.     Comments: Pupils equal and reactive, vision grossly intact.  Red reflex intact.  Cardiovascular:     Rate and Rhythm: Normal rate and regular rhythm.     Heart sounds: No murmur heard. Pulmonary:     Effort: Pulmonary effort is normal. No respiratory distress.     Breath sounds: Normal breath sounds.  Abdominal:     Palpations: Abdomen is soft.     Tenderness: There is no abdominal tenderness.  Musculoskeletal:        General: Normal range of motion.     Cervical back: Neck supple.  Skin:    General: Skin is warm and dry.  Neurological:     General: No focal deficit present.     Mental Status: She is alert and oriented to person, place, and time.     Sensory: No sensory deficit.     Motor: No weakness.     Comments: Mental Status:  Alert, thought content appropriate, able to give a coherent history. Speech fluent without evidence of aphasia. Able to follow 2 step commands without difficulty.  Cranial Nerves:  II:  Peripheral visual fields grossly normal, pupils equal, round, reactive to light III,IV, VI: ptosis not present, extra-ocular motions intact bilaterally  V,VII: smile symmetric, facial light touch sensation equal VIII: hearing grossly normal to voice  X: uvula elevates symmetrically  XI: bilateral shoulder shrug symmetric and strong XII: midline tongue extension without fassiculations Motor:  Normal tone. 5/5 strength of BUE and BLE major muscle groups including strong and equal grip strength and dorsiflexion/plantar flexion Sensory: light touch normal in all extremities. Cerebellar: normal finger-to-nose with  bilateral upper extremities, Romberg sign absent Gait: normal gait and balance. Able to walk on toes and heels with ease.       ED Results / Procedures / Treatments   Labs (all labs ordered are listed, but only abnormal results are displayed) Labs Reviewed  COMPREHENSIVE METABOLIC PANEL - Abnormal; Notable for the following components:      Result Value   Glucose, Bld 129 (*)    All other components within normal limits  URINALYSIS, ROUTINE W REFLEX MICROSCOPIC - Abnormal; Notable for the following components:   APPearance HAZY (*)  Ketones, ur 5 (*)    Protein, ur 30 (*)    All other components within normal limits  CBC WITH DIFFERENTIAL/PLATELET    EKG None  Radiology DG Chest 1 View  Result Date: 08/19/2020 CLINICAL DATA:  Shortness of breath EXAM: CHEST  1 VIEW COMPARISON:  October 18, 2019 FINDINGS: The heart size and mediastinal contours are within normal limits. Left lower lobe atelectasis for scarring. No focal consolidation. No visible pleural effusion or pneumothorax. The visualized skeletal structures are unremarkable. IMPRESSION: No acute cardiopulmonary disease Electronically Signed   By: Dahlia Bailiff MD   On: 08/19/2020 18:10   CT Head Wo Contrast  Result Date: 08/19/2020 CLINICAL DATA:  Frontal headache with blurred vision, tingling in feet, history diabetes mellitus, hypertension EXAM: CT HEAD WITHOUT CONTRAST TECHNIQUE: Contiguous axial images were obtained from the base of the skull through the vertex without intravenous contrast. Sagittal and coronal MPR images reconstructed from axial data set. COMPARISON:  05/12/2009 FINDINGS: Brain: Normal ventricular morphology. No midline shift or mass effect. Normal appearance of brain parenchyma. No intracranial hemorrhage, mass lesion, evidence of acute infarction, or extra-axial fluid collection. Vascular: No hyperdense vessels Skull: Intact Sinuses/Orbits: Partial opacification of sphenoid sinus, chronic. Remaining  paranasal sinuses and mastoid air cells clear Other: N/A IMPRESSION: No acute intracranial abnormalities. Electronically Signed   By: Lavonia Dana M.D.   On: 08/19/2020 18:32    Procedures Procedures   Medications Ordered in ED Medications  ibuprofen (ADVIL) tablet 600 mg (600 mg Oral Given 08/19/20 2219)    ED Course  I have reviewed the triage vital signs and the nursing notes.  Pertinent labs & imaging results that were available during my care of the patient were reviewed by me and considered in my medical decision making (see chart for details).    MDM Rules/Calculators/A&P                         60 year old female presents to the ER with complaints of headache and blurry vision.  On arrival, she is well-appearing, ambulating about the room with no ataxia, vitals are during.  Normal neurologic exam with no focal neurodeficits.  Vision is grossly intact.  Pupils are equal and reactive.  Lab work and imaging ordered in triage, reviewed by me.  CMP and CBC largely unremarkable, glucose of 129, no evidence of DKA.  Her UA shows small amount of ketones and proteinuria likely consistent with dehydration, there is a note in the chart that the patient was prescribed antibiotics for UTI but the patient has not picked this up yet.  No evidence of UTI on UA today.  Patient denying any urinary symptoms.  CT of the head without any acute abnormalities.  Chest x-ray without evidence of pneumonia.  EKG without any ischemic changes.  Patient with overall reassuring neurologic exam, she states that her blurry vision is waxing and waning, throughout the entire vision field.  Low suspicion for stroke, dissection, dural sinus thrombosis, retinal detachment, acute angle closure glaucoma.   She does not have a history of migraines, question complicated migraine versus possible diabetic retinopathy given tingling in her feet as well.  Patient states that she used to have poorly controlled diabetes, but lately has  been doing better with her blood sugars and her compliance with her medications.  Patient does not have any chest pain or shortness of breath currently, low suspicion for ACS, pneumonia, PE  Patient treated with ibuprofen, headache improved.  Patient stable for discharge with follow-up with PCP and possible ophthalmology.  We discussed return precautions.  She was understanding and is agreeable.  Stable for discharge.  Final Clinical Impression(s) / ED Diagnoses Final diagnoses:  SOB (shortness of breath)  Acute nonintractable headache, unspecified headache type  Blurry vision, bilateral    Rx / DC Orders ED Discharge Orders     None            Lyndel Safe 08/19/20 2234    Davonna Belling, MD 08/20/20 2310

## 2020-12-28 ENCOUNTER — Ambulatory Visit (INDEPENDENT_AMBULATORY_CARE_PROVIDER_SITE_OTHER): Payer: No Typology Code available for payment source

## 2020-12-28 ENCOUNTER — Other Ambulatory Visit: Payer: Self-pay

## 2020-12-28 ENCOUNTER — Encounter (HOSPITAL_COMMUNITY): Payer: Self-pay

## 2020-12-28 ENCOUNTER — Ambulatory Visit (HOSPITAL_COMMUNITY)
Admission: EM | Admit: 2020-12-28 | Discharge: 2020-12-28 | Disposition: A | Discharge status: Home / Self Care | Payer: No Typology Code available for payment source

## 2020-12-28 DIAGNOSIS — S60222A Contusion of left hand, initial encounter: Secondary | ICD-10-CM | POA: Diagnosis not present

## 2020-12-28 DIAGNOSIS — W19XXXA Unspecified fall, initial encounter: Secondary | ICD-10-CM | POA: Diagnosis not present

## 2020-12-28 DIAGNOSIS — M79642 Pain in left hand: Secondary | ICD-10-CM

## 2020-12-28 MED ORDER — NAPROXEN 500 MG PO TABS: 500.0000 mg | ORAL_TABLET | Freq: Two times a day (BID) | ORAL | 0 refills | Status: AC

## 2020-12-28 NOTE — Discharge Instructions (Addendum)
Your x-ray was normal.  I suspect that you have a contusion (deep bruise) causing your symptoms.  Please take Naprosyn twice a day.  Do not take NSAIDs with this medication including aspirin, ibuprofen/Advil, naproxen/Aleve as it will cause stomach bleeding.  You can use Tylenol for breakthrough pain.  Use ice and elevation for additional symptom relief.  If you have any worsening symptoms please return for reevaluation.

## 2020-12-28 NOTE — ED Provider Notes (Signed)
[facility]    CSN: [id number] Arrival date & time: 12/28/20  1017      History   Chief Complaint Chief Complaint  Patient presents with   Fall   Hand Pain    HPI Sabrina Brooks is a 60 y.o. female.   Patient presents today with a 2-day history of left hand pain and swelling following injury.  Reports that she tripped over uneven concrete and fell onto her outstretched hand causing pain and swelling.  Pain is rated 8/9 on a 0-10 pain scale, described as throbbing, localized to dorsal left hand with radiation into wrist, worse with certain movements or palpation, no alleviating factors identified.  She has not tried any over-the-counter medications.  She is right-handed.  Denies any numbness or paresthesias.  She did not hit her head.  Denies any loss of consciousness, headache, dizziness, nausea, vomiting.  She denies previous injury or surgery involving her hand or wrist.  She was able to move her fingers but has difficulty moving the wrist secondary to pain.   Past Medical History:  Diagnosis Date   Asthma    Chest pain    Diabetes mellitus    Hypertension    Obesity    Palpitation     Patient Active Problem List   Diagnosis Date Noted   Palpitations 06/09/2011   Chest pain 06/09/2011   Diabetes mellitus 06/09/2011    Past Surgical History:  Procedure Laterality Date   ECTOPIC PREGNANCY SURGERY  1990   ECTOPIC PREGNANCY SURGERY      OB History   No obstetric history on file.      Home Medications    Prior to Admission medications   Medication Sig Start Date End Date Taking? Authorizing Provider  Dulaglutide (TRULICITY [place]) Inject into the skin.   Yes [provider]  glucose blood test strip Check your sugar in the morning before you eat breakfast, and one hour after a meal. 07/28/18   Melynda Ripple, MD  glucose monitoring kit (FREESTYLE) monitoring kit 1 each by Does not apply route daily. Check glucose once in the morning before  breakfast and 1 hour after a meal 07/28/18   Melynda Ripple, MD  naproxen (NAPROSYN) 500 MG tablet Take 1 tablet (500 mg total) by mouth 2 (two) times daily with a meal. 12/28/20   Anaiya Wisinski, Derry Skill, PA-C    Family History Family History  Problem Relation Age of Onset   Dementia Mother     Social History Social History   Tobacco Use   Smoking status: Never   Smokeless tobacco: Never  Substance Use Topics   Alcohol use: Yes    Comment: social drinker   Drug use: No     Allergies   Patient has no known allergies.   Review of Systems Review of Systems  Constitutional:  Positive for activity change. Negative for appetite change, fatigue and fever.  Respiratory:  Negative for cough and shortness of breath.   Cardiovascular:  Negative for chest pain.  Gastrointestinal:  Negative for abdominal pain, diarrhea, nausea and vomiting.  Musculoskeletal:  Positive for arthralgias and joint swelling. Negative for myalgias.  Skin:  Positive for color change. Negative for wound.  Neurological:  Negative for dizziness, weakness, light-headedness, numbness and headaches.    Physical Exam Triage Vital Signs ED Triage Vitals  Enc Vitals Group     BP 12/28/20 1051 132/84     Pulse Rate 12/28/20 1051 87     Resp 12/28/20  1051 17     Temp 12/28/20 1051 98.6 F (37 C)     Temp Source 12/28/20 1051 Oral     SpO2 12/28/20 1051 97 %     Weight --      Height --      Head Circumference --      Peak Flow --      Pain Score 12/28/20 1049 9     Pain Loc --      Pain Edu? --      Excl. in [place]? --    No data found.  Updated Vital Signs BP 132/84 (BP Location: Right Arm)   Pulse 87   Temp 98.6 F (37 C) (Oral)   Resp 17   LMP 07/27/2016   SpO2 97%   Visual Acuity Right Eye Distance:   Left Eye Distance:   Bilateral Distance:    Right Eye Near:   Left Eye Near:    Bilateral Near:     Physical Exam Vitals reviewed.  Constitutional:      General: She is awake. She is not in  acute distress.    Appearance: Normal appearance. She is well-developed. She is not ill-appearing.     Comments: Very pleasant female appears stated age no acute distress sitting comfortably in exam room  HENT:     Head: Normocephalic and atraumatic.  Cardiovascular:     Rate and Rhythm: Normal rate and regular rhythm.     Heart sounds: Normal heart sounds, S1 normal and S2 normal. No murmur heard. Pulmonary:     Effort: Pulmonary effort is normal.     Breath sounds: Normal breath sounds. No wheezing, rhonchi or rales.     Comments: Clear to auscultation bilaterally Abdominal:     Palpations: Abdomen is soft.     Tenderness: There is no abdominal tenderness.  Musculoskeletal:     Left hand: Swelling, tenderness and bony tenderness present. Decreased range of motion. Decreased strength. There is no disruption of two-point discrimination. Normal capillary refill.     Comments: Left hand/wrist: Tenderness palpation over dorsal left hand and wrist.  No deformity noted.  Hands neurovascularly intact.  Decreased range of motion with flexion and extension at wrist.  No snuffbox tenderness.  Psychiatric:        Behavior: Behavior is cooperative.     UC Treatments / Results  Labs (all labs ordered are listed, but only abnormal results are displayed) Labs Reviewed - No data to display  EKG   Radiology DG Hand Complete Left  Result Date: 12/28/2020 CLINICAL DATA:  [place] 2 days ago, pain in the metacarpals EXAM: LEFT HAND - COMPLETE 3+ VIEW COMPARISON:  04/10/2017 FINDINGS: There is no evidence of fracture or dislocation. There is no evidence of arthropathy or other focal bone abnormality. Soft tissues are unremarkable. IMPRESSION: No fracture or dislocation of the left hand. Electronically Signed   By: Delanna Ahmadi M.D.   On: 12/28/2020 11:51    Procedures Procedures (including critical care time)  Medications Ordered in UC Medications - No data to display  Initial Impression /  Assessment and Plan / UC Course  I have reviewed the triage vital signs and the nursing notes.  Pertinent labs & imaging results that were available during my care of the patient were reviewed by me and considered in my medical decision making (see chart for details).     X-ray obtained showed no acute osseous abnormality of hand.  Discussed that contusion is likely etiology  of symptoms.  Will start anti-inflammatory medication and patient was prescribed Naprosyn 500 mg.  Discussed that she should take this with food and avoid additional NSAIDs including aspirin, ibuprofen, naproxen due to risk of GI bleeding.  She can use Tylenol for breakthrough pain.  Recommended conservative treatment including RICE protocol.  She was provided work excuse note.  Discussed alarm symptoms that warrant emergent evaluation.  Strict return precautions given to which she expressed understanding.  Final Clinical Impressions(s) / UC Diagnoses   Final diagnoses:  Fall, initial encounter  Contusion of left hand, initial encounter  Left hand pain     Discharge Instructions      Your x-ray was normal.  I suspect that you have a contusion (deep bruise) causing your symptoms.  Please take Naprosyn twice a day.  Do not take NSAIDs with this medication including aspirin, ibuprofen/Advil, naproxen/Aleve as it will cause stomach bleeding.  You can use Tylenol for breakthrough pain.  Use ice and elevation for additional symptom relief.  If you have any worsening symptoms please return for reevaluation.     ED Prescriptions     Medication Sig Dispense Auth. Provider   naproxen (NAPROSYN) 500 MG tablet Take 1 tablet (500 mg total) by mouth 2 (two) times daily with a meal. 15 tablet Krysia Zahradnik K, PA-C      PDMP not reviewed this encounter.   Terrilee Croak, PA-C 12/28/20 1216

## 2020-12-28 NOTE — ED Triage Notes (Signed)
Pt reports pain and swelling in left hand x 2 days. States she fell over the left hand.

## 2021-01-08 ENCOUNTER — Ambulatory Visit: Payer: No Typology Code available for payment source | Admitting: Family Medicine

## 2021-11-18 ENCOUNTER — Ambulatory Visit: Payer: 59 | Admitting: Podiatry

## 2021-12-01 ENCOUNTER — Ambulatory Visit (INDEPENDENT_AMBULATORY_CARE_PROVIDER_SITE_OTHER): Payer: 59 | Admitting: Podiatry

## 2021-12-01 DIAGNOSIS — Z91199 Patient's noncompliance with other medical treatment and regimen due to unspecified reason: Secondary | ICD-10-CM

## 2021-12-01 NOTE — Progress Notes (Signed)
Patient was no-show for appointment today 

## 2022-01-06 IMAGING — CT CT HEAD W/O CM
4 series · 16 of 47 positions shown, 18 images · non-contrast
Comparison: 05/12/2009

CLINICAL DATA: Frontal headache with blurred vision, tingling in
feet, history diabetes mellitus, hypertension

EXAM:
CT HEAD WITHOUT CONTRAST
TECHNIQUE: Contiguous axial images were obtained from the base of the skull
through the vertex without intravenous contrast. Sagittal and
coronal MPR images reconstructed from axial data set.

[Series 3: head without · axial · non-contrast · 0.45mm/px · z∈[+1433,+1548]mm · 7 of 31 slices shown, 9 images]
[im 4/31  brain]
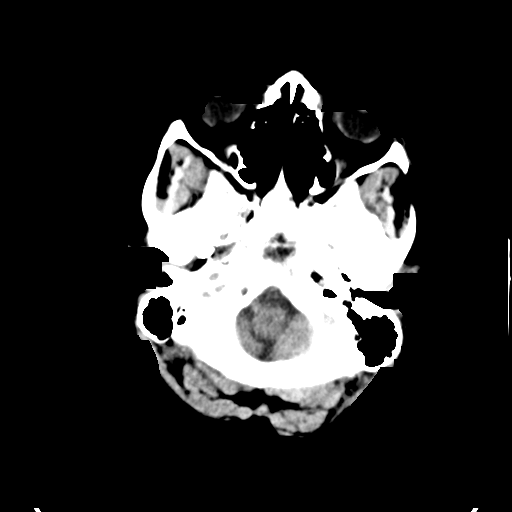
[im 4/31  bone]
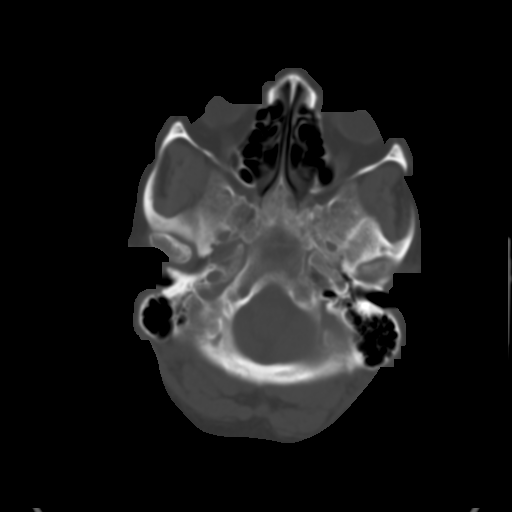
[im 8/31  brain]
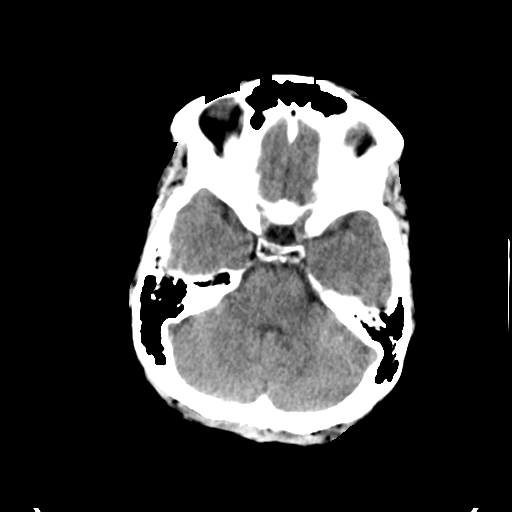
[im 12/31  brain]
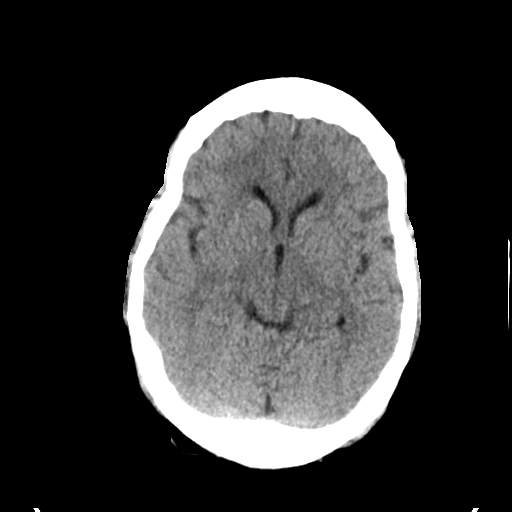
[im 16/31  brain]
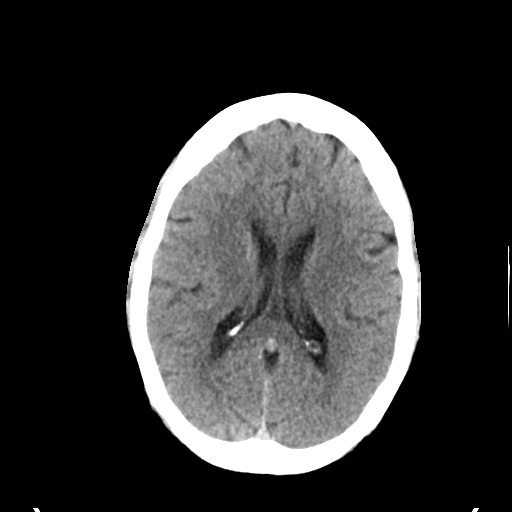
[im 19/31  brain]
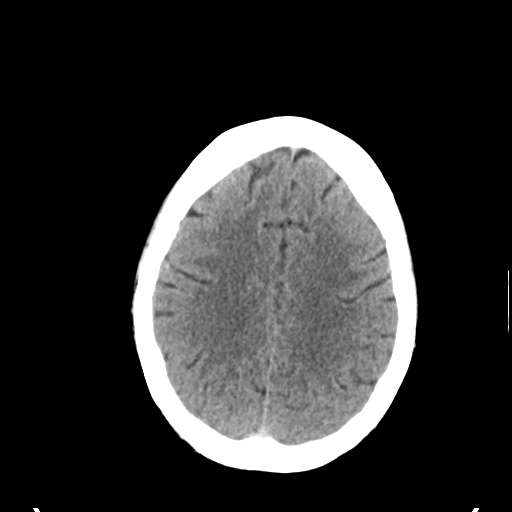
[im 19/31  bone]
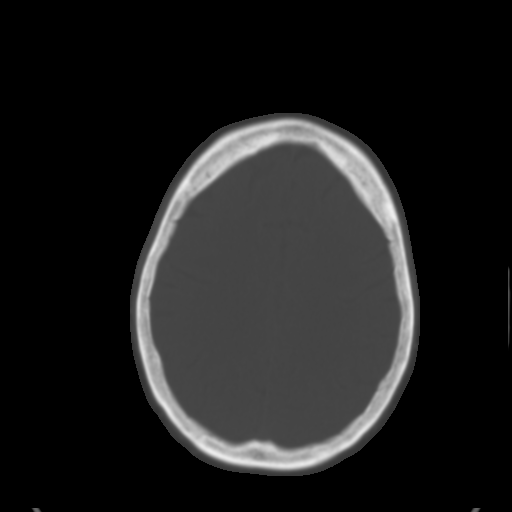
[im 23/31  brain]
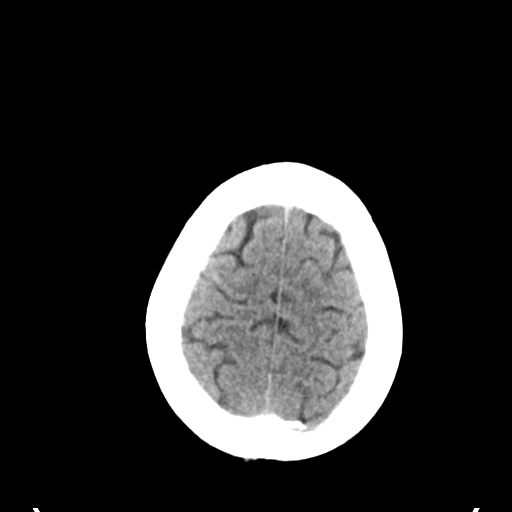
[im 27/31  brain]
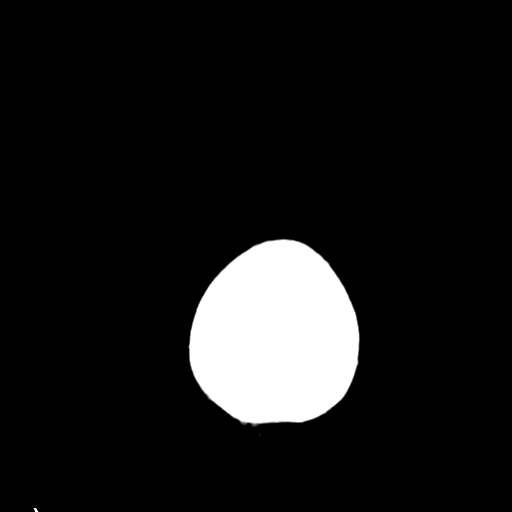

[Series 4: ax head bone · axial · 0.38mm/px · z∈[+1432,+1460]mm · 3 of 77 slices shown]
[im 8/77  bone]
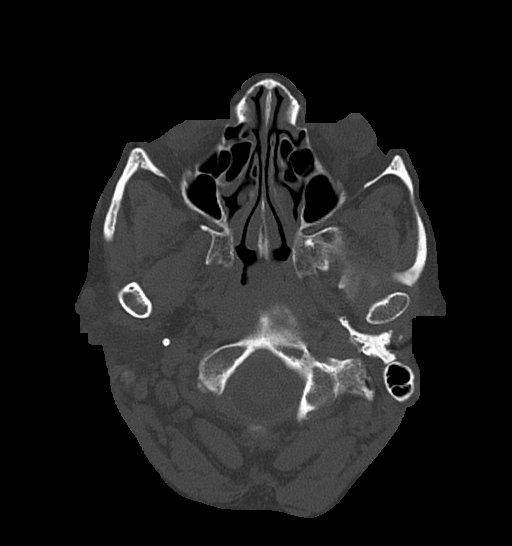
[im 16/77  bone]
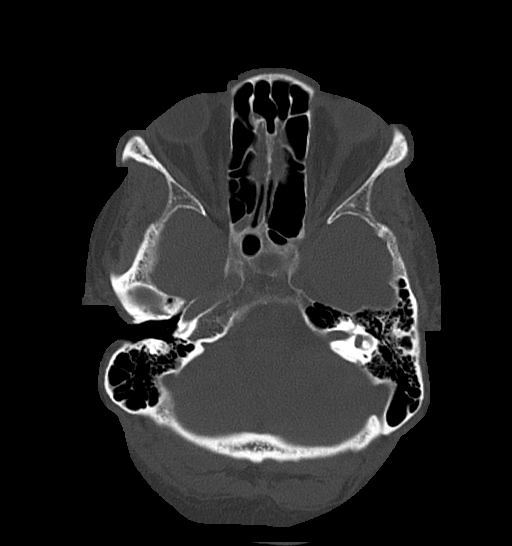
[im 23/77  bone]
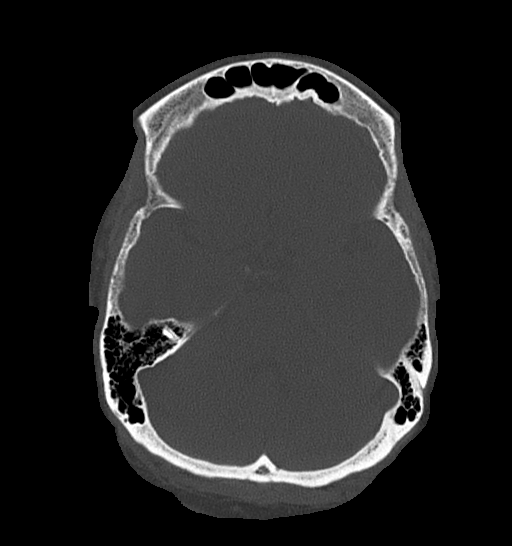

[Series 5: head without cor · coronal · non-contrast · 0.30mm/px · 3 of 60 slices shown]
[im 20/60  brain]
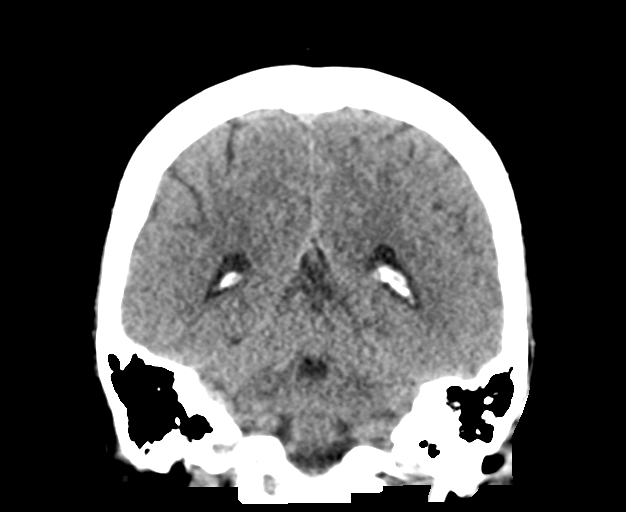
[im 27/60  brain]
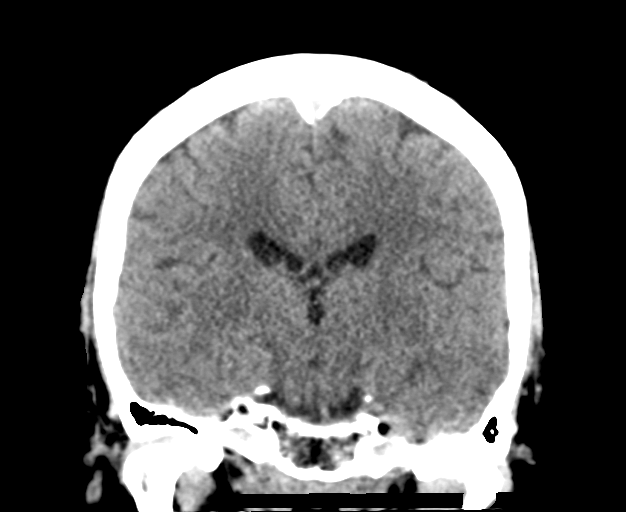
[im 33/60  brain]
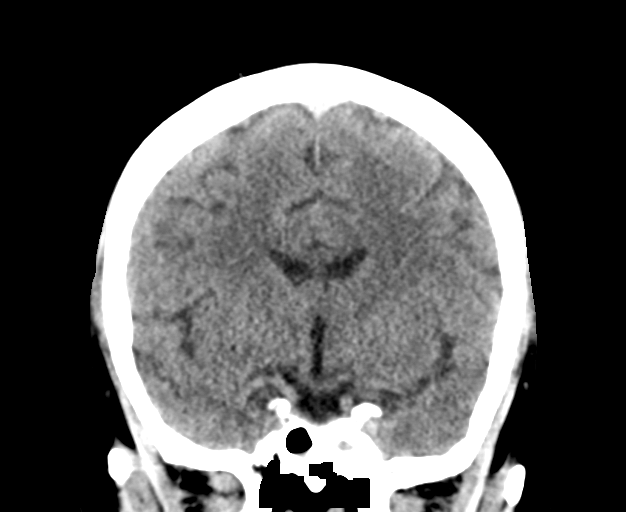

[Series 6: head without sag · sagittal · non-contrast · 0.30mm/px · 3 of 49 slices shown]
[im 17/49  brain]
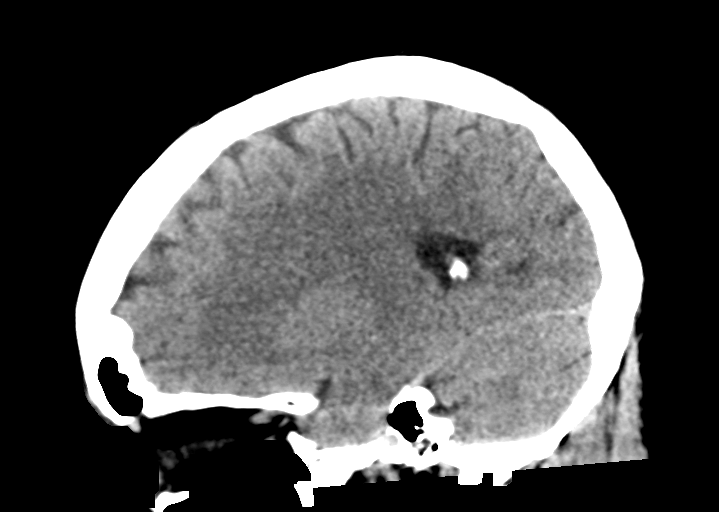
[im 25/49  brain]
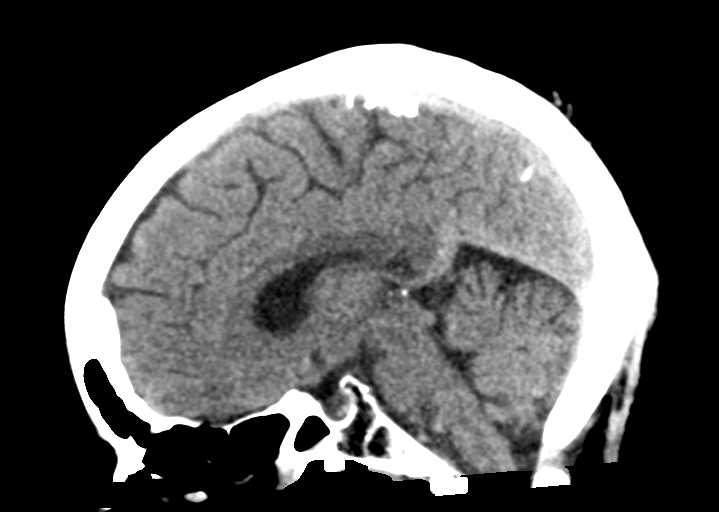
[im 33/49  brain]
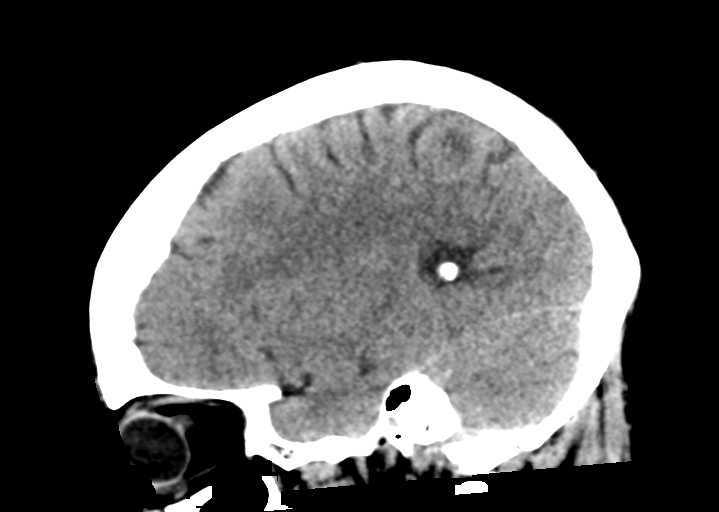

[16 of 47 positions shown; findings below may reference images not displayed]

FINDINGS: Brain: Normal ventricular morphology. No midline shift or mass
effect. Normal appearance of brain parenchyma. No intracranial
hemorrhage, mass lesion, evidence of acute infarction, or
extra-axial fluid collection.

Vascular: No hyperdense vessels

Skull: Intact

Sinuses/Orbits: Partial opacification of sphenoid sinus, chronic.
Remaining paranasal sinuses and mastoid air cells clear

Other: N/A
IMPRESSION: No acute intracranial abnormalities.

## 2022-08-29 ENCOUNTER — Other Ambulatory Visit: Payer: Self-pay

## 2022-08-29 ENCOUNTER — Emergency Department (HOSPITAL_COMMUNITY): Payer: No Typology Code available for payment source

## 2022-08-29 ENCOUNTER — Encounter (HOSPITAL_COMMUNITY): Payer: Self-pay

## 2022-08-29 ENCOUNTER — Emergency Department (HOSPITAL_COMMUNITY)
Admission: EM | Admit: 2022-08-29 | Discharge: 2022-08-29 | Disposition: A | Discharge status: Home / Self Care | Payer: No Typology Code available for payment source | Attending: Emergency Medicine | Admitting: Emergency Medicine

## 2022-08-29 DIAGNOSIS — R000 Tachycardia, unspecified: Secondary | ICD-10-CM | POA: Insufficient documentation

## 2022-08-29 DIAGNOSIS — R739 Hyperglycemia, unspecified: Secondary | ICD-10-CM

## 2022-08-29 DIAGNOSIS — R5383 Other fatigue: Secondary | ICD-10-CM | POA: Diagnosis present

## 2022-08-29 DIAGNOSIS — E1165 Type 2 diabetes mellitus with hyperglycemia: Secondary | ICD-10-CM | POA: Insufficient documentation

## 2022-08-29 DIAGNOSIS — U071 COVID-19: Secondary | ICD-10-CM | POA: Insufficient documentation

## 2022-08-29 DIAGNOSIS — R42 Dizziness and giddiness: Secondary | ICD-10-CM | POA: Insufficient documentation

## 2022-08-29 LAB — BASIC METABOLIC PANEL
Anion gap: 11 (ref 5–15)
BUN: 19 mg/dL (ref 8–23)
CO2: 25 mmol/L (ref 22–32)
Calcium: 9.5 mg/dL (ref 8.9–10.3)
Chloride: 98 mmol/L (ref 98–111)
Creatinine, Ser: 0.81 mg/dL (ref 0.44–1.00)
GFR, Estimated: >60 mL/min (ref >60)
Glucose, Bld: 253 mg/dL — ABNORMAL HIGH (ref 70–99)
Potassium: 3.7 mmol/L (ref 3.5–5.1)
Sodium: 134 mmol/L — ABNORMAL LOW (ref 135–145)

## 2022-08-29 LAB — URINALYSIS, ROUTINE W REFLEX MICROSCOPIC
Bacteria, UA: NONE SEEN
Bilirubin Urine: NEGATIVE
Glucose, UA: >=500 mg/dL — AB
Ketones, ur: 5 mg/dL — AB
Leukocytes,Ua: NEGATIVE
Nitrite: NEGATIVE
Protein, ur: 100 mg/dL — AB
Specific Gravity, Urine: 1.013 (ref 1.005–1.030)
pH: 6 (ref 5.0–8.0)

## 2022-08-29 LAB — HEPATIC FUNCTION PANEL
ALT: 23 U/L (ref 0–44)
AST: 16 U/L (ref 15–41)
Albumin: 4.1 g/dL (ref 3.5–5.0)
Alkaline Phosphatase: 81 U/L (ref 38–126)
Bilirubin, Direct: 0.2 mg/dL (ref 0.0–0.2)
Indirect Bilirubin: 0.7 mg/dL (ref 0.3–0.9)
Total Bilirubin: 0.9 mg/dL (ref 0.3–1.2)
Total Protein: 7.9 g/dL (ref 6.5–8.1)

## 2022-08-29 LAB — CBC
HCT: 40.5 % (ref 36.0–46.0)
Hemoglobin: 13.5 g/dL (ref 12.0–15.0)
MCH: 29.3 pg (ref 26.0–34.0)
MCHC: 33.3 g/dL (ref 30.0–36.0)
MCV: 87.9 fL (ref 80.0–100.0)
Platelets: 227 10*3/uL (ref 150–400)
RBC: 4.61 MIL/uL (ref 3.87–5.11)
RDW: 12 % (ref 11.5–15.5)
WBC: 7.6 10*3/uL (ref 4.0–10.5)
nRBC: 0 % (ref 0.0–0.2)

## 2022-08-29 LAB — CBG MONITORING, ED: Glucose-Capillary: 230 mg/dL — ABNORMAL HIGH (ref 70–99)

## 2022-08-29 LAB — LIPASE, BLOOD: Lipase: 27 U/L (ref 11–51)

## 2022-08-29 LAB — SARS CORONAVIRUS 2 BY RT PCR: SARS Coronavirus 2 by RT PCR: POSITIVE — AB

## 2022-08-29 MED ORDER — PROCHLORPERAZINE EDISYLATE 10 MG/2ML IJ SOLN
INTRAMUSCULAR | Status: AC
  Administered 2022-08-29: 10 mg via INTRAVENOUS
  Filled 2022-08-29: qty 2

## 2022-08-29 MED ORDER — KETOROLAC TROMETHAMINE 30 MG/ML IJ SOLN
30.0000 mg | Freq: Once | INTRAMUSCULAR | Status: AC
  Administered 2022-08-29: 30 mg via INTRAVENOUS
  Filled 2022-08-29: qty 1

## 2022-08-29 MED ORDER — PROCHLORPERAZINE EDISYLATE 10 MG/2ML IJ SOLN: 10.0000 mg | Freq: Once | INTRAMUSCULAR | Status: AC

## 2022-08-29 MED ORDER — SODIUM CHLORIDE 0.9 % IV BOLUS
1000.0000 mL | Freq: Once | INTRAVENOUS | Status: AC
  Administered 2022-08-29: 1000 mL via INTRAVENOUS

## 2022-08-29 NOTE — Discharge Instructions (Signed)
You are diagnosed with COVID virus infection in the ER today.  Most people are symptomatic for 5 to 10 days.  I expect you to make recovery after this period.  I provided a work note to stay home and rest.  Please note your blood sugar level was also high today, at approximately 250.  Continue to take your diabetes medicine at home, call your primary care provider's office to arrange your follow-up appointment early next week.

## 2022-08-29 NOTE — ED Triage Notes (Signed)
Pt BIB [facility] from work where she was cooking a meal when she started having a headache and dizziness. Pt states it lasted about an hour before calling EMS. Pt is a diabetic with her CBG in the 300's, BP was elevated as well.

## 2022-08-29 NOTE — ED Notes (Signed)
CBG 230 

## 2022-08-29 NOTE — ED Provider Notes (Signed)
[place] EMERGENCY DEPARTMENT AT [facility] Provider Note   CSN: [id number] Arrival date & time: 08/29/22  1427     History  Chief Complaint  Patient presents with   Dizziness    Sabrina Brooks is a 62 y.o. female with history of diabetes, presenting to the ED with complaint of headaches, nausea, fatigue.  Patient reports symptoms been ongoing for about 2 weeks.  She says she has had a near daily frontal headache, although it does at times go away, and is not associated with lying flat or with activity.  It tends to be near her frontal sinuses.  She does not suffer from chronic headaches or migraines.  She denies neck stiffness.  She reports that she has had some nausea, a scratchy throat. No abdominal pain.  She works in a nursing facility.  HPI     Home Medications Prior to Admission medications   Medication Sig Start Date End Date Taking? Authorizing Provider  Dulaglutide (TRULICITY [place]) Inject into the skin.    [provider]  glucose blood test strip Check your sugar in the morning before you eat breakfast, and one hour after a meal. 07/28/18   Domenick Gong, MD  glucose monitoring kit (FREESTYLE) monitoring kit 1 each by Does not apply route daily. Check glucose once in the morning before breakfast and 1 hour after a meal 07/28/18   Domenick Gong, MD  naproxen (NAPROSYN) 500 MG tablet Take 1 tablet (500 mg total) by mouth 2 (two) times daily with a meal. 12/28/20   Raspet, Noberto Retort, PA-C      Allergies    Patient has no known allergies.    Review of Systems   Review of Systems  Physical Exam Updated Vital Signs BP 137/80   Pulse 97   Temp 100.2 F (37.9 C) (Oral)   Resp 15   Ht 5\' 6"  (1.676 m)   Wt 81.6 kg   LMP 07/27/2016   SpO2 96%   BMI 29.05 kg/m  Physical Exam Constitutional:      General: She is not in acute distress. HENT:     Head: Normocephalic and atraumatic.  Eyes:     Conjunctiva/sclera: Conjunctivae normal.      Pupils: Pupils are equal, round, and reactive to light.  Cardiovascular:     Rate and Rhythm: Regular rhythm. Tachycardia present.  Pulmonary:     Effort: Pulmonary effort is normal. No respiratory distress.  Abdominal:     General: There is no distension.     Tenderness: There is no abdominal tenderness.  Musculoskeletal:     Cervical back: Normal range of motion and neck supple. No rigidity.  Skin:    General: Skin is warm and dry.  Neurological:     General: No focal deficit present.     Mental Status: She is alert. Mental status is at baseline.  Psychiatric:        Mood and Affect: Mood normal.        Behavior: Behavior normal.     ED Results / Procedures / Treatments   Labs (all labs ordered are listed, but only abnormal results are displayed) Labs Reviewed  SARS CORONAVIRUS 2 BY RT PCR - Abnormal; Notable for the following components:      Result Value   SARS Coronavirus 2 by RT PCR POSITIVE (*)    All other components within normal limits  BASIC METABOLIC PANEL - Abnormal; Notable for the following components:   Sodium 134 (*)  Glucose, Bld 253 (*)    All other components within normal limits  URINALYSIS, ROUTINE W REFLEX MICROSCOPIC - Abnormal; Notable for the following components:   Glucose, UA >=500 (*)    Hgb urine dipstick SMALL (*)    Ketones, ur 5 (*)    Protein, ur 100 (*)    All other components within normal limits  CBG MONITORING, ED - Abnormal; Notable for the following components:   Glucose-Capillary 230 (*)    All other components within normal limits  CBC  HEPATIC FUNCTION PANEL  LIPASE, BLOOD    EKG EKG Interpretation Date/Time:  Saturday August 29 2022 15:19:12 EDT Ventricular Rate:  107 PR Interval:  157 QRS Duration:  83 QT Interval:  319 QTC Calculation: 426 R Axis:   -57  Text Interpretation: Sinus tachycardia LAD, consider left anterior fascicular block Confirmed by Alvester Chou [phone]) on 08/29/2022 3:22:54 PM  Radiology CT  Head Wo Contrast  Result Date: 08/29/2022 CLINICAL DATA:  Frontal headache for 1 week, increasing in severity EXAM: CT HEAD WITHOUT CONTRAST TECHNIQUE: Contiguous axial images were obtained from the base of the skull through the vertex without intravenous contrast. RADIATION DOSE REDUCTION: This exam was performed according to the departmental dose-optimization program which includes automated exposure control, adjustment of the mA and/or kV according to patient size and/or use of iterative reconstruction technique. COMPARISON:  08/19/2020 FINDINGS: Brain: No evidence of acute infarction, hemorrhage, hydrocephalus, extra-axial collection or mass lesion/mass effect. Vascular: No hyperdense vessel or unexpected calcification. Skull: Normal. Negative for fracture or focal lesion. Sinuses/Orbits: No acute finding. Other: None. IMPRESSION: No acute intracranial pathology. No noncontrast CT findings to explain headache. Electronically Signed   By: Jearld Lesch M.D.   On: 08/29/2022 17:32   DG Chest 2 View  Result Date: 08/29/2022 CLINICAL DATA:  Dizziness EXAM: CHEST - 2 VIEW COMPARISON:  08/19/2020 FINDINGS: Cardiac shadow is stable. Lungs are well aerated bilaterally. No focal infiltrate or effusion is seen. No bony abnormality is noted. IMPRESSION: No active cardiopulmonary disease. Electronically Signed   By: Alcide Clever M.D.   On: 08/29/2022 17:05    Procedures Procedures    Medications Ordered in ED Medications  sodium chloride 0.9 % bolus 1,000 mL (0 mLs Intravenous Stopped 08/29/22 1753)  prochlorperazine (COMPAZINE) injection 10 mg (10 mg Intravenous Given 08/29/22 1614)  ketorolac (TORADOL) 30 MG/ML injection 30 mg (30 mg Intravenous Given 08/29/22 1612)    ED Course/ Medical Decision Making/ A&P Clinical Course as of 08/29/22 2323  Sat Aug 29, 2022  1725 SARS Coronavirus 2 by RT PCR(!): POSITIVE [MT]    Clinical Course User Index [MT] Shayle Donahoo, Kermit Balo, MD                              Medical Decision Making Amount and/or Complexity of Data Reviewed Labs: ordered. Decision-making details documented in ED Course. Radiology: ordered.  Risk Prescription drug management.   This patient presents to the ED with concern for headache, sore throat, chills, weakness. This involves an extensive number of treatment options, and is a complaint that carries with it a high risk of complications and morbidity.  The differential diagnosis includes viral infection versus bacterial infection versus other  Co-morbidities that complicate the patient evaluation: History of diabetes and high risk of infection  I ordered and personally interpreted labs.  The pertinent results include: COVID-positive.  Mild hyperglycemia.  No other emergent findings  I ordered  imaging studies including CT head, x-ray of the chest I independently visualized and interpreted imaging which showed no emergent findings I agree with the radiologist interpretation  The patient was maintained on a cardiac monitor.  I personally viewed and interpreted the cardiac monitored which showed an underlying rhythm of: Sinus tachycardia  Per my interpretation the patient's ECG shows no acute ischemic findings  I ordered medication including IV fluids, IV Toradol, IV Compazine for headache  I have reviewed the patients home medicines and have made adjustments as needed  Test Considered: Doubt meningitis, acute PE, no indication for LP at this time.  After the interventions noted above, I reevaluated the patient and found that they have: improved headache and nausea significantly improved  Dispostion:  After consideration of the diagnostic results and the patients response to treatment, I feel that the patent would benefit from outpatient follow-up.  At this point there is no indication for emergent hospitalization.  The patient has symptoms ongoing for over 7 days, is not a candidate for antiviral therapy.  She will need  to follow-up with her doctor in the office.  She is quite comfortable going home and calling a ride to take her home at this time.  Sabrina Brooks was evaluated in Emergency Department on 08/29/2022 for the symptoms described in the history of present illness. She was evaluated in the context of the global COVID-19 pandemic, which necessitated consideration that the patient might be at risk for infection with the SARS-CoV-2 virus that causes COVID-19. Institutional protocols and algorithms that pertain to the evaluation of patients at risk for COVID-19 are in a state of rapid change based on information released by regulatory bodies including the CDC and federal and state organizations. These policies and algorithms were followed during the patient's care in the ED.          Final Clinical Impression(s) / ED Diagnoses Final diagnoses:  Dizziness  COVID-19  Hyperglycemia    Rx / DC Orders ED Discharge Orders     None         Falisha Osment, Kermit Balo, MD 08/29/22 2324
